# Patient Record
Sex: Male | Born: 1975 | Race: White | Hispanic: No | Smoking: Current every day smoker
Health system: Southern US, Community
[De-identification: ages and names within clinical notes are randomized; demographics above are authoritative.]

## PROBLEM LIST (undated history)

## (undated) ENCOUNTER — Emergency Department (HOSPITAL_COMMUNITY): Admission: EM

## (undated) DIAGNOSIS — F909 Attention-deficit hyperactivity disorder, unspecified type: Secondary | ICD-10-CM

## (undated) DIAGNOSIS — K219 Gastro-esophageal reflux disease without esophagitis: Secondary | ICD-10-CM

## (undated) DIAGNOSIS — L0291 Cutaneous abscess, unspecified: Secondary | ICD-10-CM

## (undated) HISTORY — PX: INNER EAR SURGERY: SHX679

---

## 1997-10-24 ENCOUNTER — Emergency Department (HOSPITAL_COMMUNITY): Admission: EM | Admit: 1997-10-24 | Discharge: 1997-10-24 | Payer: Self-pay | Admitting: Emergency Medicine

## 2003-03-09 ENCOUNTER — Other Ambulatory Visit (HOSPITAL_COMMUNITY): Admission: RE | Admit: 2003-03-09 | Discharge: 2003-03-13 | Payer: Self-pay | Admitting: Psychiatry

## 2005-04-03 ENCOUNTER — Emergency Department (HOSPITAL_COMMUNITY): Admission: EM | Admit: 2005-04-03 | Discharge: 2005-04-03 | Payer: Self-pay | Admitting: Emergency Medicine

## 2005-10-29 ENCOUNTER — Emergency Department (HOSPITAL_COMMUNITY): Admission: EM | Admit: 2005-10-29 | Discharge: 2005-10-29 | Payer: Self-pay | Admitting: Emergency Medicine

## 2005-11-07 ENCOUNTER — Ambulatory Visit: Payer: Self-pay | Admitting: Internal Medicine

## 2006-05-28 ENCOUNTER — Ambulatory Visit: Payer: Self-pay | Admitting: Internal Medicine

## 2006-05-28 LAB — CONVERTED CEMR LAB
Basophils Relative: 1.9 % — ABNORMAL HIGH (ref 0.0–1.0)
Eosinophils Relative: 5.7 % — ABNORMAL HIGH (ref 0.0–5.0)
HCT: 44.8 % (ref 39.0–52.0)
Hemoglobin: 15.8 g/dL (ref 13.0–17.0)
Lymphocytes Relative: 23 % (ref 12.0–46.0)
Neutrophils Relative %: 63.8 % (ref 43.0–77.0)
WBC: 7.5 10*3/uL (ref 4.5–10.5)

## 2006-06-04 ENCOUNTER — Encounter (INDEPENDENT_AMBULATORY_CARE_PROVIDER_SITE_OTHER): Payer: Self-pay | Admitting: *Deleted

## 2006-06-04 ENCOUNTER — Ambulatory Visit: Payer: Self-pay | Admitting: Internal Medicine

## 2007-07-28 ENCOUNTER — Emergency Department (HOSPITAL_COMMUNITY): Admission: EM | Admit: 2007-07-28 | Discharge: 2007-07-29 | Payer: Self-pay | Admitting: Emergency Medicine

## 2010-09-08 NOTE — Assessment & Plan Note (Signed)
[facility]                         GASTROENTEROLOGY OFFICE NOTE   NAME:COLEMANNarada, Uzzle                     MRN:          [id number]  DATE:05/28/2006                            DOB:          [birth year]    CHIEF COMPLAINT:  Indigestion.   HISTORY:  A 35 year old white man that has had problems with a lot of  indigestion.  It has gotten quite worse in the last month.  He describes  heartburn sensations for some time, usually controlled with ranitidine  or antacids.  However, lately he eats and he feels full in the  subxyphoid area, throat feels irritated.  He has not had any nausea, but  he will have post prandial vomiting, particularly if he eats a large  meal.  This occurs within 30 minutes to 60 minutes after eating.  He may  have lost a little bit of weight.  In the past week or so, things are  much worse, he says.  He says alcohol will set him on fire.  He drinks  EchoStar about every 3rd day.  He did not really quantify the  amounts, but it sounds like several drinks.  He has some gas and  bloating, but no bowel movement changes, and no melena or signs of  bleeding.   MEDICATIONS:  1. Zantac.  2. Mylanta.   ALLERGIES:  NO KNOWN DRUG ALLERGIES.   PAST MEDICAL HISTORY:  Otherwise unremarkable.  No surgeries, no chronic  medical conditions.   FAMILY HISTORY:  Noncontributory.   Our review is negative.   SOCIAL HISTORY:  He is married and he works in Therapist, music care.  He has 1  daughter.  He smokes a half pack per day for 10 years, and I have  counseled him as to quitting today.  Alcohol as above.  He has never had  any trouble with alcohol with DUI or work-related issues, etc.   REVIEW OF SYSTEMS:  Otherwise negative.   PHYSICAL EXAMINATION:  Reveals a well-developed, well-nourished, young  white man in no acute distress.  Height 5 feet 10 inches, weight 161 pounds.  Blood pressure 110/70,  pulse 60.  EYES:  Anicteric.  ENT:  Normal  mouth and posterior oropharynx.  NECK:  Supple, no thyroid mass.  CHEST:  Clear.  HEART:  S1, S2, no murmurs, gallops, or rubs.  ABDOMEN:  Soft, nontender without organomegaly or mass.  EXTREMITIES:  No peripheral edema.  SKIN:  He has some tattoos.  He has some erythema on the upper trunk,  anteriorly and posteriorly (he was at a tanning bed yesterday).  PSYCH:  He is alert and oriented x3.   ASSESSMENT AND PLAN:  Epigastric pain, vomiting, ?dysphagia, heartburn  and indigestion.  He has postprandial vomiting.  I am concerned about  gastric outlet obstruction developing.  I did not get a history of  NSAIDS or anything.  He could have an h. pylori-related ulcer.  Malignancy is in the differential as well.   PLAN:  Schedule upper GI endoscopy.  Risks, benefits and indications are  explained.  CBC will be checked today as well.  Further plans pending  the above.   I appreciate the opportunity to care for this patient.     Iva Boop, MD,FACG  Electronically Signed    CEG/MedQ  DD: 05/28/2006  DT: 05/28/2006  Job #: [id number]   cc:   Titus Dubin. Alwyn Ren, MD,FACP,FCCP

## 2010-09-08 NOTE — Assessment & Plan Note (Signed)
[facility]                         GASTROENTEROLOGY OFFICE NOTE   NAME:COLEMANTylon, Kemmerling                     MRN:          [id number]  DATE:05/28/2006                            DOB:          November 19, 1975    ASSESSMENT AND PLAN:  Epigastric pain, vomiting, ?dysphagia, heartburn  and indigestion.  He has postprandial vomiting.  I am concerned about  gastric outlet obstruction developing.  I did not get a history of  NSAIDS or anything.  He could have an h. pylori-related ulcer.  Malignancy is in the differential as well.   PLAN:  Schedule upper GI endoscopy.  Risks, benefits and indications are  explained.  CBC will be checked today as well.  Further plans pending  the above.     Iva Boop, MD,FACG     CEG/MedQ  DD: 05/28/2006  DT: 05/28/2006  Job #: [id number]   cc:   Titus Dubin. Alwyn Ren, MD,FACP,FCCP

## 2010-09-08 NOTE — Assessment & Plan Note (Signed)
[facility]                          [place] OFFICE NOTE   NAME:Johnathan Brown, Johnathan Brown                     MRN:          [id number]  DATE:11/07/2005                            DOB:          03-24-1976    Johnathan Brown was seen November 07, 2005, brought in by his mother, Johnathan Brown, with multiple complaints.   The chief concern was that he was under an increased amount of stress, with  stomach pain present for 1 year.  He was taking over-the-counter Zantac 6 to  7 of the 150 mg pills per day.  He was having difficulty with insomnia  related to his separation from his wife.  They have a small child, age 30.   Since the separation he has had encounters with other women, and wishes  tests for bacterial and yeast diseases.  Subsequently I learned that he  already had [facility] and other [facility]   He denies dysphagia, but questions black stool.  He has lost 20 pounds in 2  months.  Although our record demonstrates a weight of 142 in July 2004, he  stated that he weighed 180 pounds 2 months prior to the November 07, 2005 visit.  At this time weight was 159.2.   He has never been hospitalized.   His mother has Barrett's esophagus, and father has Crohn's disease.   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  His wife refuses counseling.   He has been drinking 5 to 6 alcoholic beverages a day, 4 to 5 glasses of tea  or cola per day.   He does admit to suicidal ideation.  He has seen a psychiatrist in the past,  but stated that his past drug abuse history made such care of no benefit.  He also made the comment, She couldn't speak [place].   PHYSICAL EXAMINATION:  HEENT:  Oropharynx revealed gelatinous change to his  uvula. There was no significant erythema.  LUNGS:  He has minimal rales.  HEART:  An S4 with flow murmur was noted.  ABDOMEN:  Nontender.  He had no organomegaly.  SKIN:  Warm and dry.  NEUROLOGIC:  He was oriented to time, place and person, but had no interest  in addressing preventive measures.   Specifically, after I listed the triggers for hyperacidity and reflux, he  stated that he could not make changes in any of these.  I did prescribe  Nexium twice a day, and stated that he would need a gastroenterologic  consultation if symptoms persisted for possible endoscopy.   I stated that psychiatric evaluation because of suicidal ideation was  mandatory, but he had no interest in pursuing this.   I described the risks of unprotected sex as a preventive measure.   His hemoglobin was 15.9.  Stool cards are pending.  Urinalysis revealed no  growth.   Unless he is willing to seek psychiatric help, I feel that his prognosis is  poor.  I have discussed commitment with his mother after he had left the  exam room, but she did not  believe she could pursue this.  Without such care  and improvement in his insight, I am fearful of a possible catastrophic  outcome.  This was expressed to his mother.                                   Titus Dubin. Alwyn Ren, MD, [facility]   [facility]/MedQ  DD:  11/13/2005  DT:  11/14/2005  Job #:  [id number]

## 2015-01-22 DIAGNOSIS — L0291 Cutaneous abscess, unspecified: Secondary | ICD-10-CM

## 2015-01-22 HISTORY — DX: Cutaneous abscess, unspecified: L02.91

## 2015-01-24 ENCOUNTER — Encounter (HOSPITAL_COMMUNITY): Payer: Self-pay | Admitting: Emergency Medicine

## 2015-01-24 DIAGNOSIS — L02413 Cutaneous abscess of right upper limb: Principal | ICD-10-CM | POA: Diagnosis present

## 2015-01-24 DIAGNOSIS — L03113 Cellulitis of right upper limb: Secondary | ICD-10-CM | POA: Diagnosis present

## 2015-01-24 DIAGNOSIS — K219 Gastro-esophageal reflux disease without esophagitis: Secondary | ICD-10-CM | POA: Diagnosis present

## 2015-01-24 DIAGNOSIS — W460XXA Contact with hypodermic needle, initial encounter: Secondary | ICD-10-CM | POA: Diagnosis present

## 2015-01-24 DIAGNOSIS — F1910 Other psychoactive substance abuse, uncomplicated: Secondary | ICD-10-CM | POA: Diagnosis present

## 2015-01-24 DIAGNOSIS — Z23 Encounter for immunization: Secondary | ICD-10-CM

## 2015-01-24 DIAGNOSIS — F17200 Nicotine dependence, unspecified, uncomplicated: Secondary | ICD-10-CM | POA: Diagnosis present

## 2015-01-24 DIAGNOSIS — F553 Abuse of steroids or hormones: Secondary | ICD-10-CM | POA: Diagnosis present

## 2015-01-24 DIAGNOSIS — M545 Low back pain: Secondary | ICD-10-CM | POA: Diagnosis present

## 2015-01-24 LAB — CBC
HCT: 43.2 % (ref 39.0–52.0)
Hemoglobin: 14.8 g/dL (ref 13.0–17.0)
MCH: 29 pg (ref 26.0–34.0)
MCHC: 34.3 g/dL (ref 30.0–36.0)
MCV: 84.5 fL (ref 78.0–100.0)
Platelets: 232 K/uL (ref 150–400)
RBC: 5.11 MIL/uL (ref 4.22–5.81)
RDW: 13.3 % (ref 11.5–15.5)
WBC: 10.4 K/uL (ref 4.0–10.5)

## 2015-01-24 LAB — BASIC METABOLIC PANEL WITH GFR
Anion gap: 9 (ref 5–15)
BUN: 13 mg/dL (ref 6–20)
CO2: 27 mmol/L (ref 22–32)
Calcium: 8.5 mg/dL — ABNORMAL LOW (ref 8.9–10.3)
Chloride: 99 mmol/L — ABNORMAL LOW (ref 101–111)
Creatinine, Ser: 0.98 mg/dL (ref 0.61–1.24)
GFR calc Af Amer: >60 mL/min
GFR calc non Af Amer: >60 mL/min
Glucose, Bld: 108 mg/dL — ABNORMAL HIGH (ref 65–99)
Potassium: 4 mmol/L (ref 3.5–5.1)
Sodium: 135 mmol/L (ref 135–145)

## 2015-01-24 NOTE — ED Notes (Signed)
Pt reports that he injected steroids into R upper arm 3 days ago and now arm is swollen red and painful. Unsure of fevers- sts that he hasn't been feeling good tonight.

## 2015-01-25 ENCOUNTER — Encounter (HOSPITAL_COMMUNITY): Payer: Self-pay | Admitting: General Practice

## 2015-01-25 ENCOUNTER — Inpatient Hospital Stay (HOSPITAL_COMMUNITY)
Admission: EM | Admit: 2015-01-25 | Discharge: 2015-01-29 | DRG: 581 | Discharge status: Against Medical Advice | Payer: Self-pay | Attending: Internal Medicine | Admitting: Internal Medicine

## 2015-01-25 ENCOUNTER — Emergency Department (HOSPITAL_COMMUNITY): Payer: Self-pay

## 2015-01-25 ENCOUNTER — Observation Stay (HOSPITAL_COMMUNITY): Payer: MEDICAID | Admitting: Anesthesiology

## 2015-01-25 ENCOUNTER — Observation Stay (HOSPITAL_COMMUNITY): Payer: Self-pay | Admitting: Anesthesiology

## 2015-01-25 ENCOUNTER — Encounter (HOSPITAL_COMMUNITY)
Admission: EM | Discharge status: Against Medical Advice | Payer: Self-pay | Source: Home / Self Care | Attending: Internal Medicine

## 2015-01-25 DIAGNOSIS — G8929 Other chronic pain: Secondary | ICD-10-CM | POA: Diagnosis present

## 2015-01-25 DIAGNOSIS — M25521 Pain in right elbow: Secondary | ICD-10-CM

## 2015-01-25 DIAGNOSIS — L02413 Cutaneous abscess of right upper limb: Secondary | ICD-10-CM | POA: Diagnosis present

## 2015-01-25 DIAGNOSIS — Z720 Tobacco use: Secondary | ICD-10-CM | POA: Diagnosis present

## 2015-01-25 DIAGNOSIS — M545 Other chronic pain: Secondary | ICD-10-CM | POA: Diagnosis present

## 2015-01-25 DIAGNOSIS — L03113 Cellulitis of right upper limb: Secondary | ICD-10-CM | POA: Insufficient documentation

## 2015-01-25 DIAGNOSIS — L0291 Cutaneous abscess, unspecified: Secondary | ICD-10-CM | POA: Diagnosis present

## 2015-01-25 DIAGNOSIS — L02419 Cutaneous abscess of limb, unspecified: Secondary | ICD-10-CM | POA: Diagnosis present

## 2015-01-25 DIAGNOSIS — F1910 Other psychoactive substance abuse, uncomplicated: Secondary | ICD-10-CM | POA: Diagnosis present

## 2015-01-25 DIAGNOSIS — L0390 Cellulitis, unspecified: Secondary | ICD-10-CM

## 2015-01-25 HISTORY — DX: Cutaneous abscess, unspecified: L02.91

## 2015-01-25 HISTORY — DX: Gastro-esophageal reflux disease without esophagitis: K21.9

## 2015-01-25 HISTORY — PX: I&D EXTREMITY: SHX5045

## 2015-01-25 LAB — RAPID URINE DRUG SCREEN, HOSP PERFORMED
AMPHETAMINES: POSITIVE — AB
BENZODIAZEPINES: POSITIVE — AB
Barbiturates: NOT DETECTED
Cocaine: NOT DETECTED
OPIATES: POSITIVE — AB
Tetrahydrocannabinol: POSITIVE — AB

## 2015-01-25 LAB — PROTIME-INR
INR: 1.07 (ref 0.00–1.49)
PROTHROMBIN TIME: 14.1 s (ref 11.6–15.2)

## 2015-01-25 LAB — CBC WITH DIFFERENTIAL/PLATELET
Basophils Absolute: 0 10*3/uL (ref 0.0–0.1)
Basophils Relative: 0 %
EOS PCT: 5 %
Eosinophils Absolute: 0.5 10*3/uL (ref 0.0–0.7)
HCT: 48.4 % (ref 39.0–52.0)
Hemoglobin: 16.3 g/dL (ref 13.0–17.0)
LYMPHS ABS: 1.9 10*3/uL (ref 0.7–4.0)
LYMPHS PCT: 21 %
MCH: 28.5 pg (ref 26.0–34.0)
MCHC: 33.7 g/dL (ref 30.0–36.0)
MCV: 84.8 fL (ref 78.0–100.0)
MONO ABS: 0.7 10*3/uL (ref 0.1–1.0)
Monocytes Relative: 7 %
Neutro Abs: 6.1 10*3/uL (ref 1.7–7.7)
Neutrophils Relative %: 67 %
PLATELETS: 264 10*3/uL (ref 150–400)
RBC: 5.71 MIL/uL (ref 4.22–5.81)
RDW: 13.5 % (ref 11.5–15.5)
WBC: 9.1 10*3/uL (ref 4.0–10.5)

## 2015-01-25 LAB — COMPREHENSIVE METABOLIC PANEL
ALT: 13 U/L — AB (ref 17–63)
AST: 22 U/L (ref 15–41)
Albumin: 3.7 g/dL (ref 3.5–5.0)
Alkaline Phosphatase: 77 U/L (ref 38–126)
Anion gap: 11 (ref 5–15)
BUN: 11 mg/dL (ref 6–20)
CHLORIDE: 101 mmol/L (ref 101–111)
CO2: 27 mmol/L (ref 22–32)
CREATININE: 0.82 mg/dL (ref 0.61–1.24)
Calcium: 9.2 mg/dL (ref 8.9–10.3)
Glucose, Bld: 90 mg/dL (ref 65–99)
Potassium: 4.2 mmol/L (ref 3.5–5.1)
Sodium: 139 mmol/L (ref 135–145)
TOTAL PROTEIN: 6.7 g/dL (ref 6.5–8.1)
Total Bilirubin: 0.9 mg/dL (ref 0.3–1.2)

## 2015-01-25 LAB — URINALYSIS, ROUTINE W REFLEX MICROSCOPIC
Bilirubin Urine: NEGATIVE
GLUCOSE, UA: NEGATIVE mg/dL
HGB URINE DIPSTICK: NEGATIVE
Ketones, ur: NEGATIVE mg/dL
Leukocytes, UA: NEGATIVE
Nitrite: NEGATIVE
Protein, ur: NEGATIVE mg/dL
SPECIFIC GRAVITY, URINE: 1.019 (ref 1.005–1.030)
Urobilinogen, UA: 1 mg/dL (ref 0.0–1.0)
pH: 6 (ref 5.0–8.0)

## 2015-01-25 LAB — TYPE AND SCREEN
ABO/RH(D): O POS
Antibody Screen: NEGATIVE

## 2015-01-25 LAB — SURGICAL PCR SCREEN
MRSA, PCR: NEGATIVE
Staphylococcus aureus: POSITIVE — AB

## 2015-01-25 SURGERY — MINOR IRRIGATION AND DEBRIDEMENT EXTREMITY
Anesthesia: General | Site: Arm Upper | Laterality: Right

## 2015-01-25 MED ORDER — METHOCARBAMOL 1000 MG/10ML IJ SOLN
500.0000 mg | Freq: Four times a day (QID) | INTRAVENOUS | Status: DC | PRN
  Filled 2015-01-25: qty 5

## 2015-01-25 MED ORDER — FENTANYL CITRATE (PF) 250 MCG/5ML IJ SOLN
INTRAMUSCULAR | Status: AC
  Filled 2015-01-25: qty 5

## 2015-01-25 MED ORDER — 0.9 % SODIUM CHLORIDE (POUR BTL) OPTIME
TOPICAL | Status: DC | PRN
  Administered 2015-01-25: 1000 mL

## 2015-01-25 MED ORDER — LACTATED RINGERS IV SOLN
INTRAVENOUS | Status: DC | PRN
  Administered 2015-01-25: 21:00:00 via INTRAVENOUS

## 2015-01-25 MED ORDER — ONDANSETRON HCL 4 MG PO TABS: 4.0000 mg | ORAL_TABLET | Freq: Four times a day (QID) | ORAL | Status: DC | PRN

## 2015-01-25 MED ORDER — PNEUMOCOCCAL VAC POLYVALENT 25 MCG/0.5ML IJ INJ
0.5000 mL | INJECTION | INTRAMUSCULAR | Status: AC
  Administered 2015-01-26: 0.5 mL via INTRAMUSCULAR
  Filled 2015-01-25 (×2): qty 0.5

## 2015-01-25 MED ORDER — LIDOCAINE HCL (CARDIAC) 20 MG/ML IV SOLN
INTRAVENOUS | Status: AC
  Filled 2015-01-25: qty 20

## 2015-01-25 MED ORDER — SUCCINYLCHOLINE CHLORIDE 20 MG/ML IJ SOLN
INTRAMUSCULAR | Status: AC
  Filled 2015-01-25: qty 1

## 2015-01-25 MED ORDER — ACETAMINOPHEN 325 MG PO TABS: 650.0000 mg | ORAL_TABLET | Freq: Four times a day (QID) | ORAL | Status: DC | PRN

## 2015-01-25 MED ORDER — ALUM & MAG HYDROXIDE-SIMETH 200-200-20 MG/5ML PO SUSP: 30.0000 mL | Freq: Four times a day (QID) | ORAL | Status: DC | PRN

## 2015-01-25 MED ORDER — HYDROMORPHONE HCL 1 MG/ML IJ SOLN
0.2500 mg | INTRAMUSCULAR | Status: DC | PRN
  Administered 2015-01-25 (×3): 0.5 mg via INTRAVENOUS

## 2015-01-25 MED ORDER — SODIUM CHLORIDE 0.9 % IV SOLN
INTRAVENOUS | Status: DC
  Administered 2015-01-25: 23:00:00 via INTRAVENOUS

## 2015-01-25 MED ORDER — FENTANYL CITRATE (PF) 100 MCG/2ML IJ SOLN
INTRAMUSCULAR | Status: DC | PRN
  Administered 2015-01-25 (×2): 50 ug via INTRAVENOUS
  Administered 2015-01-25: 200 ug via INTRAVENOUS

## 2015-01-25 MED ORDER — ONDANSETRON HCL 4 MG/2ML IJ SOLN
INTRAMUSCULAR | Status: AC
  Filled 2015-01-25: qty 8

## 2015-01-25 MED ORDER — ONDANSETRON HCL 4 MG/2ML IJ SOLN
INTRAMUSCULAR | Status: DC | PRN
  Administered 2015-01-25: 4 mg via INTRAVENOUS

## 2015-01-25 MED ORDER — ENOXAPARIN SODIUM 40 MG/0.4ML ~~LOC~~ SOLN
40.0000 mg | SUBCUTANEOUS | Status: DC
  Administered 2015-01-25 – 2015-01-26 (×2): 40 mg via SUBCUTANEOUS
  Filled 2015-01-25 (×5): qty 0.4

## 2015-01-25 MED ORDER — ONDANSETRON HCL 4 MG/2ML IJ SOLN: 4.0000 mg | Freq: Four times a day (QID) | INTRAMUSCULAR | Status: DC | PRN

## 2015-01-25 MED ORDER — VANCOMYCIN HCL IN DEXTROSE 1-5 GM/200ML-% IV SOLN
1000.0000 mg | Freq: Three times a day (TID) | INTRAVENOUS | Status: DC
  Administered 2015-01-25 – 2015-01-29 (×12): 1000 mg via INTRAVENOUS
  Filled 2015-01-25 (×14): qty 200

## 2015-01-25 MED ORDER — MIDAZOLAM HCL 2 MG/2ML IJ SOLN
INTRAMUSCULAR | Status: AC
  Filled 2015-01-25: qty 4

## 2015-01-25 MED ORDER — MIDAZOLAM HCL 5 MG/5ML IJ SOLN
INTRAMUSCULAR | Status: DC | PRN
  Administered 2015-01-25: 2 mg via INTRAVENOUS

## 2015-01-25 MED ORDER — ROCURONIUM BROMIDE 50 MG/5ML IV SOLN
INTRAVENOUS | Status: AC
  Filled 2015-01-25: qty 1

## 2015-01-25 MED ORDER — ACETAMINOPHEN 650 MG RE SUPP: 650.0000 mg | Freq: Four times a day (QID) | RECTAL | Status: DC | PRN

## 2015-01-25 MED ORDER — MIDAZOLAM HCL 2 MG/2ML IJ SOLN
INTRAMUSCULAR | Status: AC
Start: 2015-01-25 — End: 2015-01-25
  Filled 2015-01-25: qty 4

## 2015-01-25 MED ORDER — MORPHINE SULFATE (PF) 2 MG/ML IV SOLN
1.0000 mg | INTRAVENOUS | Status: DC | PRN
  Administered 2015-01-25 – 2015-01-26 (×7): 1 mg via INTRAVENOUS
  Filled 2015-01-25 (×9): qty 1

## 2015-01-25 MED ORDER — METHOCARBAMOL 500 MG PO TABS
500.0000 mg | ORAL_TABLET | Freq: Four times a day (QID) | ORAL | Status: DC | PRN
  Administered 2015-01-26 – 2015-01-28 (×3): 500 mg via ORAL
  Filled 2015-01-25 (×3): qty 1

## 2015-01-25 MED ORDER — CHLORHEXIDINE GLUCONATE CLOTH 2 % EX PADS
6.0000 | MEDICATED_PAD | Freq: Every day | CUTANEOUS | Status: DC
Start: 2015-01-25 — End: 2015-01-29
  Administered 2015-01-25 – 2015-01-28 (×4): 6 via TOPICAL

## 2015-01-25 MED ORDER — PHENYLEPHRINE 40 MCG/ML (10ML) SYRINGE FOR IV PUSH (FOR BLOOD PRESSURE SUPPORT)
PREFILLED_SYRINGE | INTRAVENOUS | Status: AC
  Filled 2015-01-25: qty 20

## 2015-01-25 MED ORDER — LIDOCAINE HCL (CARDIAC) 20 MG/ML IV SOLN
INTRAVENOUS | Status: DC | PRN
  Administered 2015-01-25: 50 mg via INTRAVENOUS

## 2015-01-25 MED ORDER — PROMETHAZINE HCL 25 MG/ML IJ SOLN: 6.2500 mg | INTRAMUSCULAR | Status: DC | PRN

## 2015-01-25 MED ORDER — HYDROMORPHONE HCL 1 MG/ML IJ SOLN
INTRAMUSCULAR | Status: AC
  Filled 2015-01-25: qty 1

## 2015-01-25 MED ORDER — MEPERIDINE HCL 25 MG/ML IJ SOLN: 6.2500 mg | INTRAMUSCULAR | Status: DC | PRN

## 2015-01-25 MED ORDER — NICOTINE 21 MG/24HR TD PT24
21.0000 mg | MEDICATED_PATCH | Freq: Every day | TRANSDERMAL | Status: DC
  Administered 2015-01-26 – 2015-01-28 (×3): 21 mg via TRANSDERMAL
  Filled 2015-01-25 (×4): qty 1

## 2015-01-25 MED ORDER — PROPOFOL 10 MG/ML IV BOLUS
INTRAVENOUS | Status: DC | PRN
  Administered 2015-01-25: 150 mg via INTRAVENOUS
  Administered 2015-01-25: 50 mg via INTRAVENOUS

## 2015-01-25 MED ORDER — METOCLOPRAMIDE HCL 10 MG PO TABS: 5.0000 mg | ORAL_TABLET | Freq: Three times a day (TID) | ORAL | Status: DC | PRN

## 2015-01-25 MED ORDER — OXYCODONE HCL 5 MG PO TABS
5.0000 mg | ORAL_TABLET | ORAL | Status: DC | PRN
  Administered 2015-01-25 – 2015-01-27 (×2): 5 mg via ORAL
  Filled 2015-01-25 (×2): qty 1

## 2015-01-25 MED ORDER — KCL IN DEXTROSE-NACL 20-5-0.9 MEQ/L-%-% IV SOLN
INTRAVENOUS | Status: DC
  Administered 2015-01-25: 11:00:00 via INTRAVENOUS
  Filled 2015-01-25 (×9): qty 1000

## 2015-01-25 MED ORDER — CETYLPYRIDINIUM CHLORIDE 0.05 % MT LIQD
7.0000 mL | Freq: Two times a day (BID) | OROMUCOSAL | Status: DC
  Administered 2015-01-25 – 2015-01-26 (×3): 7 mL via OROMUCOSAL

## 2015-01-25 MED ORDER — PIPERACILLIN-TAZOBACTAM 3.375 G IVPB
3.3750 g | Freq: Three times a day (TID) | INTRAVENOUS | Status: DC
Start: 2015-01-25 — End: 2015-01-29
  Administered 2015-01-25 – 2015-01-29 (×12): 3.375 g via INTRAVENOUS
  Filled 2015-01-25 (×14): qty 50

## 2015-01-25 MED ORDER — VANCOMYCIN HCL IN DEXTROSE 1-5 GM/200ML-% IV SOLN
1000.0000 mg | Freq: Once | INTRAVENOUS | Status: AC
  Administered 2015-01-25: 1000 mg via INTRAVENOUS
  Filled 2015-01-25: qty 200

## 2015-01-25 MED ORDER — INFLUENZA VAC SPLIT QUAD 0.5 ML IM SUSY
0.5000 mL | PREFILLED_SYRINGE | INTRAMUSCULAR | Status: AC
  Administered 2015-01-26: 0.5 mL via INTRAMUSCULAR
  Filled 2015-01-25 (×2): qty 0.5

## 2015-01-25 MED ORDER — MUPIROCIN 2 % EX OINT
1.0000 "application " | TOPICAL_OINTMENT | Freq: Two times a day (BID) | CUTANEOUS | Status: DC
  Administered 2015-01-25 – 2015-01-28 (×7): 1 via NASAL
  Filled 2015-01-25 (×2): qty 22

## 2015-01-25 MED ORDER — DIAZEPAM 5 MG PO TABS
5.0000 mg | ORAL_TABLET | Freq: Two times a day (BID) | ORAL | Status: DC | PRN
  Administered 2015-01-25 – 2015-01-28 (×6): 5 mg via ORAL
  Filled 2015-01-25 (×7): qty 1

## 2015-01-25 MED ORDER — METOCLOPRAMIDE HCL 5 MG/ML IJ SOLN: 5.0000 mg | Freq: Three times a day (TID) | INTRAMUSCULAR | Status: DC | PRN

## 2015-01-25 SURGICAL SUPPLY — 41 items
BLADE SURG 10 STRL SS (BLADE) IMPLANT
BLADE SURG 21 STRL SS (BLADE) ×3 IMPLANT
BNDG COHESIVE 4X5 TAN STRL (GAUZE/BANDAGES/DRESSINGS) ×9 IMPLANT
BNDG COHESIVE 6X5 TAN STRL LF (GAUZE/BANDAGES/DRESSINGS) IMPLANT
BNDG GAUZE ELAST 4 BULKY (GAUZE/BANDAGES/DRESSINGS) ×3 IMPLANT
COVER SURGICAL LIGHT HANDLE (MISCELLANEOUS) ×9 IMPLANT
DRAIN PENROSE 1/4X12 LTX STRL (WOUND CARE) ×3 IMPLANT
DRAPE U-SHAPE 47X51 STRL (DRAPES) ×3 IMPLANT
DRSG ADAPTIC 3X8 NADH LF (GAUZE/BANDAGES/DRESSINGS) ×3 IMPLANT
DURAPREP 26ML APPLICATOR (WOUND CARE) ×3 IMPLANT
ELECT CAUTERY BLADE 6.4 (BLADE) IMPLANT
ELECT REM PT RETURN 9FT ADLT (ELECTROSURGICAL)
ELECTRODE REM PT RTRN 9FT ADLT (ELECTROSURGICAL) IMPLANT
GAUZE SPONGE 4X4 12PLY STRL (GAUZE/BANDAGES/DRESSINGS) ×3 IMPLANT
GLOVE BIOGEL PI IND STRL 9 (GLOVE) ×1 IMPLANT
GLOVE BIOGEL PI INDICATOR 9 (GLOVE) ×2
GLOVE SURG ORTHO 9.0 STRL STRW (GLOVE) ×3 IMPLANT
GOWN STRL REUS W/ TWL LRG LVL3 (GOWN DISPOSABLE) ×1 IMPLANT
GOWN STRL REUS W/ TWL XL LVL3 (GOWN DISPOSABLE) ×1 IMPLANT
GOWN STRL REUS W/TWL LRG LVL3 (GOWN DISPOSABLE) ×2
GOWN STRL REUS W/TWL XL LVL3 (GOWN DISPOSABLE) ×2
HANDPIECE INTERPULSE COAX TIP (DISPOSABLE)
KIT BASIN OR (CUSTOM PROCEDURE TRAY) ×3 IMPLANT
KIT ROOM TURNOVER OR (KITS) ×3 IMPLANT
MANIFOLD NEPTUNE II (INSTRUMENTS) IMPLANT
NS IRRIG 1000ML POUR BTL (IV SOLUTION) ×3 IMPLANT
PACK ORTHO EXTREMITY (CUSTOM PROCEDURE TRAY) ×3 IMPLANT
PAD ARMBOARD 7.5X6 YLW CONV (MISCELLANEOUS) ×6 IMPLANT
SET HNDPC FAN SPRY TIP SCT (DISPOSABLE) IMPLANT
STOCKINETTE IMPERVIOUS 9X36 MD (GAUZE/BANDAGES/DRESSINGS) ×3 IMPLANT
SUT ETHILON 2 0 PSLX (SUTURE) ×6 IMPLANT
SWAB COLLECTION DEVICE MRSA (MISCELLANEOUS) ×3 IMPLANT
SWAB CULTURE LIQUID MINI MALE (MISCELLANEOUS) ×3 IMPLANT
TOWEL OR 17X24 6PK STRL BLUE (TOWEL DISPOSABLE) ×3 IMPLANT
TOWEL OR 17X26 10 PK STRL BLUE (TOWEL DISPOSABLE) ×3 IMPLANT
TUBE ANAEROBIC SPECIMEN COL (MISCELLANEOUS) ×3 IMPLANT
TUBE CONNECTING 12'X1/4 (SUCTIONS) ×1
TUBE CONNECTING 12X1/4 (SUCTIONS) ×2 IMPLANT
UNDERPAD 30X30 INCONTINENT (UNDERPADS AND DIAPERS) ×3 IMPLANT
WATER STERILE IRR 1000ML POUR (IV SOLUTION) ×3 IMPLANT
YANKAUER SUCT BULB TIP NO VENT (SUCTIONS) ×3 IMPLANT

## 2015-01-25 NOTE — Progress Notes (Addendum)
UDS positive for amphetamines, benzodiazepine's and THC. Also positive for opiates but patient had been taking Suboxone prior to admission.  Per CM note from 302 pm today pt admitted to IVDA w/ Heroin and last use was 7pm last night- pt reported to CM felt like was detoxing- HIV ordered  Junious Silk, ANP

## 2015-01-25 NOTE — Progress Notes (Signed)
Father exited room looking for RN.  Informed that "I've convinced him to stay for now, but he wants to eat.  If they can't do the surgery tonight, then he wants to eat."  Called OR.  Pt will take another hour before procedure.  Father to inform pt.  Will continue to monitor.

## 2015-01-25 NOTE — Anesthesia Postprocedure Evaluation (Signed)
  Anesthesia Post-op Note  Patient: Johnathan Brown  Procedure(s) Performed: Procedure(s): MINOR IRRIGATION AND DEBRIDEMENT RIGHT ARM (Right)  Patient Location: PACU  Anesthesia Type:General  Level of Consciousness: awake and alert   Airway and Oxygen Therapy: Patient Spontanous Breathing  Post-op Pain: Controlled  Post-op Assessment: Post-op Vital signs reviewed, Patient's Cardiovascular Status Stable and Respiratory Function Stable  Post-op Vital Signs: Reviewed  Filed Vitals:   01/25/15 2215  BP:   Pulse: 74  Temp: 36.9 C  Resp: 11    Complications: No apparent anesthesia complications

## 2015-01-25 NOTE — Transfer of Care (Signed)
Immediate Anesthesia Transfer of Care Note  Patient: Johnathan Brown  Procedure(s) Performed: Procedure(s): MINOR IRRIGATION AND DEBRIDEMENT RIGHT ARM (Right)  Patient Location: PACU  Anesthesia Type:General  Level of Consciousness: pt very drowsy   Airway & Oxygen Therapy: Patient Spontanous Breathing and Patient connected to face mask oxygen  Post-op Assessment: Report given to RN and Post -op Vital signs reviewed and stable  Post vital signs: Reviewed and stable  Last Vitals:  Filed Vitals:   01/25/15 2145  BP: 116/68  Pulse: 70  Temp: 36.9 C  Resp: 11    Complications: No apparent anesthesia complications

## 2015-01-25 NOTE — Progress Notes (Signed)
ANTIBIOTIC CONSULT NOTE - INITIAL  Pharmacy Consult for Vancomycin  Indication: Upper extremity cellulitis  No Known Allergies  Patient Measurements: Height:  (177.8 cm) Weight: 162 lb 6.4 oz (73.664 kg) IBW/kg (Calculated) : 73  Vital Signs: Temp: 98.5 F (36.9 C) (10/04 0703) Temp Source: Oral (10/03 2242) BP: 116/77 mmHg (10/04 0703) Pulse Rate: 72 (10/04 0703)  Labs:  Recent Labs  01/24/15 2255  WBC 10.4  HGB 14.8  PLT 232  CREATININE 0.98   Estimated Creatinine Clearance: 106.6 mL/min (by C-G formula based on Cr of 0.98).   Assessment: Upper extremity cellulitis s/p testosterone injection, WBC WNL, renal function good, pt is afebrile, other labs as above.   Goal of Therapy:   Vancomycin trough level 15-20 mcg/ml  Plan:  -Vancomycin 1000 mg IV q8h -Drug levels at steady state   Abran Duke 01/25/2015,7:15 AM

## 2015-01-25 NOTE — Consult Note (Signed)
Reason for Consult: Abscess right arm Referring Physician: ER physician  Johnathan Brown is an 39 y.o. male.  HPI: Patient is a 39 year old gentleman who states he's had a 3 day history of an abscess in his right arm. Patient states that he injected his triceps with a steroid. He is unsure of what type a steroid it was.  History reviewed. No pertinent past medical history.  History reviewed. No pertinent past surgical history.  No family history on file.  Social History:  reports that he has been smoking.  He does not have any smokeless tobacco history on file. He reports that he does not drink alcohol or use illicit drugs.  Allergies: No Known Allergies  Medications: I have reviewed the patient's current medications.  Results for orders placed or performed during the hospital encounter of 01/25/15 (from the past 48 hour(s))  CBC     Status: None   Collection Time: 01/24/15 10:55 PM  Result Value Ref Range   WBC 10.4 4.0 - 10.5 K/uL   RBC 5.11 4.22 - 5.81 MIL/uL   Hemoglobin 14.8 13.0 - 17.0 g/dL   HCT 43.2 39.0 - 52.0 %   MCV 84.5 78.0 - 100.0 fL   MCH 29.0 26.0 - 34.0 pg   MCHC 34.3 30.0 - 36.0 g/dL   RDW 13.3 11.5 - 15.5 %   Platelets 232 150 - 400 K/uL  Basic metabolic panel     Status: Abnormal   Collection Time: 01/24/15 10:55 PM  Result Value Ref Range   Sodium 135 135 - 145 mmol/L   Potassium 4.0 3.5 - 5.1 mmol/L   Chloride 99 (L) 101 - 111 mmol/L   CO2 27 22 - 32 mmol/L   Glucose, Bld 108 (H) 65 - 99 mg/dL   BUN 13 6 - 20 mg/dL   Creatinine, Ser 0.98 0.61 - 1.24 mg/dL   Calcium 8.5 (L) 8.9 - 10.3 mg/dL   GFR calc non Af Amer >60 >60 mL/min   GFR calc Af Amer >60 >60 mL/min    Comment: (NOTE) The eGFR has been calculated using the CKD EPI equation. This calculation has not been validated in all clinical situations. eGFR's persistently <60 mL/min signify possible Chronic Kidney Disease.    Anion gap 9 5 - 15    [place] Extrem Up Right Ltd  01/25/2015    CLINICAL DATA:  Right arm cellulitis  EXAM: ULTRASOUND right UPPER EXTREMITY LIMITED  TECHNIQUE: Ultrasound examination of the upper extremity soft tissues was performed in the area of clinical concern.  COMPARISON:  Radiographs 01/25/15  FINDINGS: There is a 2.1 x 0.9 x 2.8 cm collection in the subcutaneous tissues of the right upper extremity, corresponding to the area of concern. The collection has irregular mural thickening. No significant vascularity within the lumen of the collection. This is suspicious for an abscess.  IMPRESSION: Irregular soft tissue collection in the area of concern in the right upper extremity, likely representing an abscess measuring roughly 1 x 2 x 3 cm.   Electronically Signed   By: Andreas Newport M.D.   On: 01/25/2015 03:44   Dg Humerus Right  01/25/2015   CLINICAL DATA:  Right arm pain and swelling, after injecting testosterone. Initial encounter.  EXAM: RIGHT HUMERUS - 2+ VIEW  COMPARISON:  None.  FINDINGS: There is no evidence of osseous erosion. The right humerus appears intact. The soft tissues are not well assessed on radiograph. No soft tissue air is seen. The humeral head remains  seated at the glenoid fossa. The right acromioclavicular joint is unremarkable.  IMPRESSION: No evidence of osseous erosion.  No soft tissue air seen.   Electronically Signed   By: Garald Balding M.D.   On: 01/25/2015 03:23    Review of Systems  All other systems reviewed and are negative.  Blood pressure 116/77, pulse 72, temperature 98.5 F (36.9 C), temperature source Oral, resp. rate 15, height 5' 10"  (1.778 m), weight 73.664 kg (162 lb 6.4 oz), SpO2 99 %. Physical Exam  On examination patient has redness cellulitis of the right triceps area. Review of his ultrasound shows a deep abscess. Assessment/Plan: Assessment abscess right triceps mid aspect of the arm.  Plan: We'll plan for debridement of the abscess this afternoon. Risks and benefits were discussed including  neurovascular injury persistent infection and need for additional surgery. Patient states he understands and wishes to proceed at this time.  Brown,Johnathan V 01/25/2015, 7:17 AM

## 2015-01-25 NOTE — Progress Notes (Signed)
Patient left floor for OR with OR nurse for planned procedure.

## 2015-01-25 NOTE — Care Management Note (Signed)
Case Management Note  Patient Details  Name: Johnathan Brown MRN: [id number] Date of Birth: 18-Jun-1975  Subjective/Objective:                  Date-01-25-15 Tuesday Initial Assessment Spoke with patient at the bedside along with family.  Introduced self as Sports coach and explained role in discharge planning and how to be reached.  Verified patient lives at [address] [place] [address], lives with mother Johnathan Brown [phone].  Verified patient anticipates to go home with family, at time of discharge and will have part-time supervision by family at this time to best of their knowledge.  Patient has no DME. Expressed potential need for no other DME.  Patient confirmed  needing help with their medication.  Patient received pamphlet from [facility] and [facility]. CM explained to patient that they may use the on site pharmacy to fill prescriptions given to them at discharge. Patient aware that the [facility] pharmacy will not fill narcotics or pain medications prior to the patient being seen by one of their physicians.  Patient aware that they must be seen as a patient prior to the pharmacy filling the prescriptions a second time.  Follow up at [facility] entered into AVS. Patient drives to MD appointments.  Verified patient has no PCP, will F/U at [facility]. Patient states they currently receive HH services through no one.   Plan: CM will continue to follow for discharge planning and [facility] resources.   Johnathan Sabal RN BSN CM [phone]   Action/Plan:   Expected Discharge Date:                  Expected Discharge Plan:  Home/Self Care  In-House Referral:     Discharge planning Services  CM Consult  Post Acute Care Choice:    Choice offered to:     DME Arranged:    DME Agency:     HH Arranged:    HH Agency:     Status of Service:  In process, will continue to follow  Medicare Important Message Given:    Date Medicare IM  Given:    Medicare IM give by:    Date Additional Medicare IM Given:    Additional Medicare Important Message give by:     If discussed at Long Length of Stay Meetings, dates discussed:    Additional Comments:  Johnathan Sabal, RN 01/25/2015, 11:00 AM
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
## 2015-01-25 NOTE — Progress Notes (Signed)
Spoke with patient at the bedside to give map for [facility]. Patient was laying in bed, looked flushed and did not engage in conversation easily like he had earlier in the day. CM asked patient if he was ok, asking if he had a fever. Patient stated that he thought he was detoxing. He shared that he last used Heroin at 7pm last night and has been using it 2-3 times a day for the past 5 days intravenously. He stated that he sometimes uses suboxitan, but finds it hard at times to stay off Heroin. He stated that he usually has 5 episodes a year where he goes back to IV Heroin. CM asked patient when his last HIV screen was, he said it was not recently, and no results found in Epic. Due to IV drug use and patient being admitted for abscess to upper arm site where he injected steroids CM suggested HIV screen to bedside nurse Jennifer/ and charge nurse Ginger.

## 2015-01-25 NOTE — ED Notes (Signed)
Patient is resting comfortably. 

## 2015-01-25 NOTE — Progress Notes (Signed)
ANTIBIOTIC CONSULT NOTE - INITIAL  Pharmacy Consult for zosyn and vancomycin Indication: RUE abscess  No Known Allergies  Patient Measurements: Height:  (177.8 cm) Weight: 162 lb 6.4 oz (73.664 kg) IBW/kg (Calculated) : 73  Vital Signs: Temp: 98.5 F (36.9 C) (10/04 0703) BP: 116/77 mmHg (10/04 0703) Pulse Rate: 72 (10/04 0703) Intake/Output from previous day:   Intake/Output from this shift:    Labs:  Recent Labs  01/24/15 2255  WBC 10.4  HGB 14.8  PLT 232  CREATININE 0.98   Estimated Creatinine Clearance: 106.6 mL/min (by C-G formula based on Cr of 0.98). No results for input(s): VANCOTROUGH, VANCOPEAK, VANCORANDOM, GENTTROUGH, GENTPEAK, GENTRANDOM, TOBRATROUGH, TOBRAPEAK, TOBRARND, AMIKACINPEAK, AMIKACINTROU, AMIKACIN in the last 72 hours.   Medical History: Past Medical History  Diagnosis Date  . GERD (gastroesophageal reflux disease)   . Abscess 01/2015   Assessment: 39 yo male injected non-rx testosterone 4 days prior. Noticed pain/swelling in injection site  PMH: Chronic low back pain on suboxone  ID: abscess of RUE due to test. Injections. I&D planned 10/4. WBC 10.4, AF  Vancomycin 10/4>> Zosyn 10/4>>  10/4 Blood x 2  Renal: SCr 0.98  Goal of Therapy:  Vancomycin trough level 15-20 mcg/ml  Plan:  Continue vancomycin 1 gm q8h Zosyn 3.375 gm IV q8h Monitor clinical status, culture data, VT prn F/U with abx plans post I&D  Isaac Bliss, PharmD, BCPS Clinical Pharmacist Pager [phone] 01/25/2015 10:54 AM

## 2015-01-25 NOTE — ED Notes (Signed)
Perimeter of redness demarked with skin marker per MD order

## 2015-01-25 NOTE — ED Provider Notes (Signed)
CSN: [id number]   Arrival date & time 01/24/15 2231  History  By signing my name below, I, Bethel Born, attest that this documentation has been prepared under the direction and in the presence of Derwood Kaplan, MD. Electronically Signed: Bethel Born, ED Scribe. 01/25/2015. 2:58 AM.  Chief Complaint  Patient presents with  . Arm Pain    HPI The history is provided by the patient. No language interpreter was used.   GUS LITTLER is a 39 y.o. male who presents to the Emergency Department complaining of an area of pain, swelling, and redness at the right upper arm with onset 2 days ago. The pt injected testosterone in the right upper arm 3 days ago. He notes that the redness has spread down the arm over the last day.  Pt denies nausea, vomiting, fever, and chills. Right hand dominant. Healthy otherwise. Currently on Suboxone. No history of IV drug use   History reviewed. No pertinent past medical history.  History reviewed. No pertinent past surgical history.  No family history on file.  Social History  Substance Use Topics  . Smoking status: Current Every Day Smoker  . Smokeless tobacco: None  . Alcohol Use: No     Review of Systems  Constitutional: Negative for fever and chills.  Cardiovascular: Negative for chest pain.  Gastrointestinal: Negative for nausea and vomiting.  Musculoskeletal:       An area of redness, pain, and swelling at the right upper arm  Neurological: Negative for weakness.  All other systems reviewed and are negative.  Home Medications   Prior to Admission medications   Medication Sig Start Date End Date Taking? Authorizing Provider  cephALEXin (KEFLEX) 500 MG capsule Take 500 mg by mouth 4 (four) times daily.   Yes Historical Provider, MD  SUBOXONE 8-2 MG FILM Place 0.5-1.5 Film under the tongue 2 (two) times daily. 1.5 films in the morning and 0.5 in the evening 12/25/14  Yes Historical Provider, MD    Allergies  Review of patient's  allergies indicates no known allergies.  Triage Vitals: BP 130/89 mmHg  Pulse 90  Temp(Src) 98.3 F (36.8 C) (Oral)  Resp 18  Ht  (1.778 m)  Wt 162 lb 6.4 oz (73.664 kg)  BMI 23.30 kg/m2  SpO2 97%  Physical Exam  Constitutional: He is oriented to person, place, and time. He appears well-developed and well-nourished. No distress.  HENT:  Head: Normocephalic and atraumatic.  Eyes: Conjunctivae and EOM are normal.  Neck: Neck supple. No tracheal deviation present.  Cardiovascular: Normal rate.   Pulmonary/Chest: Effort normal. No respiratory distress.  Musculoskeletal: Normal range of motion.  Neurological: He is alert and oriented to person, place, and time.  Skin: Skin is warm and dry.  Psychiatric: He has a normal mood and affect. His behavior is normal.  Nursing note and vitals reviewed.   ED Course  Procedures   DIAGNOSTIC STUDIES: Oxygen Saturation is 97% on RA, normal by my interpretation.    COORDINATION OF CARE: 2:42 AM Discussed treatment plan which includes XR, lab work, and abx with pt at bedside and pt agreed to plan.  Labs Reviewed  BASIC METABOLIC PANEL - Abnormal; Notable for the following:    Chloride 99 (*)    Glucose, Bld 108 (*)    Calcium 8.5 (*)    All other components within normal limits  CBC    Imaging Review [place] Extrem Up Right Ltd  01/25/2015   CLINICAL DATA:  Right arm cellulitis  EXAM: ULTRASOUND right UPPER EXTREMITY LIMITED  TECHNIQUE: Ultrasound examination of the upper extremity soft tissues was performed in the area of clinical concern.  COMPARISON:  Radiographs 01/25/15  FINDINGS: There is a 2.1 x 0.9 x 2.8 cm collection in the subcutaneous tissues of the right upper extremity, corresponding to the area of concern. The collection has irregular mural thickening. No significant vascularity within the lumen of the collection. This is suspicious for an abscess.  IMPRESSION: Irregular soft tissue collection in the area of concern in the  right upper extremity, likely representing an abscess measuring roughly 1 x 2 x 3 cm.   Electronically Signed   By: Ellery Plunk M.D.   On: 01/25/2015 03:44   Dg Humerus Right  01/25/2015   CLINICAL DATA:  Right arm pain and swelling, after injecting testosterone. Initial encounter.  EXAM: RIGHT HUMERUS - 2+ VIEW  COMPARISON:  None.  FINDINGS: There is no evidence of osseous erosion. The right humerus appears intact. The soft tissues are not well assessed on radiograph. No soft tissue air is seen. The humeral head remains seated at the glenoid fossa. The right acromioclavicular joint is unremarkable.  IMPRESSION: No evidence of osseous erosion.  No soft tissue air seen.   Electronically Signed   By: Roanna Raider M.D.   On: 01/25/2015 03:23     I personally reviewed and evaluated these images and lab results as a part of my medical decision-making.    MDM   Final diagnoses:  Right arm cellulitis  Abscess of arm, right    I personally performed the services described in this documentation, which was scribed in my presence. The recorded information has been reviewed and is accurate.   PT comes in with cc of arm pain and swelling. Clinical concerns for phlegmon vs abscess in the R arm with cellulitis. PT is immunocompetent. [place] ordered - and the results heighten the suspicion for an abscess.  Spoke with Orthopedics, they will see the patient as consult. Will admit to medicine - given this is a well spread cellulitis COVERING MOST OF THE dominant RUE of the patient and is a result of needle use.   Derwood Kaplan, MD 01/25/15 [phone]

## 2015-01-25 NOTE — Anesthesia Preprocedure Evaluation (Addendum)
Anesthesia Evaluation  Patient identified by MRN, date of birth, ID band Patient awake    Reviewed: Allergy & Precautions, H&P , NPO status , Patient's Chart, lab work & pertinent test results  Airway Mallampati: III  TM Distance: >3 FB Neck ROM: Full    Dental  (+) Teeth Intact, Dental Advisory Given   Pulmonary Current Smoker,    breath sounds clear to auscultation       Cardiovascular negative cardio ROS   Rhythm:Regular Rate:Normal     Neuro/Psych negative neurological ROS  negative psych ROS   GI/Hepatic GERD  ,(+)     substance abuse  ,   Endo/Other  negative endocrine ROS  Renal/GU negative Renal ROS     Musculoskeletal negative musculoskeletal ROS (+)   Abdominal   Peds  Hematology negative hematology ROS (+)   Anesthesia Other Findings   Reproductive/Obstetrics                          Anesthesia Physical Anesthesia Plan  ASA: II  Anesthesia Plan: General   Post-op Pain Management:    Induction: Intravenous  Airway Management Planned: LMA  Additional Equipment:   Intra-op Plan:   Post-operative Plan: Extubation in OR  Informed Consent: I have reviewed the patients History and Physical, chart, labs and discussed the procedure including the risks, benefits and alternatives for the proposed anesthesia with the patient or authorized representative who has indicated his/her understanding and acceptance.   Dental advisory given  Plan Discussed with: CRNA  Anesthesia Plan Comments:         Anesthesia Quick Evaluation

## 2015-01-25 NOTE — Progress Notes (Signed)
Patient given map with location and contact info for [facility] where [facility] F[facility] is made for the [facility] and [facility].

## 2015-01-25 NOTE — Op Note (Signed)
01/25/2015  9:34 PM  PATIENT:  Johnathan Brown    PRE-OPERATIVE DIAGNOSIS:  Abscess right arm mid substance of the triceps  POST-OPERATIVE DIAGNOSIS:  Same  PROCEDURE:   IRRIGATION AND DEBRIDEMENT RIGHT ARM, placement Penrose drain  SURGEON:  Nadara Mustard, MD  PHYSICIAN ASSISTANT:None ANESTHESIA:   General  PREOPERATIVE INDICATIONS:  [name] is a  39 y.o. male with a diagnosis of Infected right arm who failed conservative measures and elected for surgical management.    The risks benefits and alternatives were discussed with the patient preoperatively including but not limited to the risks of infection, bleeding, nerve injury, cardiopulmonary complications, the need for revision surgery, among others, and the patient was willing to proceed.  OPERATIVE IMPLANTS: Penrose drain  OPERATIVE FINDINGS: Purulent abscess cultures obtained 2 patient is on vancomycin and Zosyn preoperatively  OPERATIVE PROCEDURE: Patient was brought to the operating room and underwent a general and aesthetic. After adequate levels anesthesia were obtained patient's right upper extremity was prepped using DuraPrep draped into a sterile field a timeout was called. A 3 cm incision was made longitudinally over the triceps. This was carried down to a purulent abscess, cultures were obtained 2, this was irrigated with normal saline, fascia and soft tissue was excised. There is no necrotic tissue. The wound was closed over a Penrose drain with 2-0 nylon. A sterile compressive dressing was applied. Patient was extubated taken to the PACU in stable condition.

## 2015-01-25 NOTE — Progress Notes (Signed)
Received report on patient from Huntley Dec, ED nurse.

## 2015-01-25 NOTE — Anesthesia Procedure Notes (Signed)
Procedure Name: LMA Insertion Date/Time: 01/25/2015 9:16 PM Performed by: Adonis Housekeeper Pre-anesthesia Checklist: Patient identified, Emergency Drugs available, Suction available and Patient being monitored Patient Re-evaluated:Patient Re-evaluated prior to inductionOxygen Delivery Method: Circle system utilized Preoxygenation: Pre-oxygenation with 100% oxygen Intubation Type: IV induction Ventilation: Mask ventilation without difficulty LMA: LMA inserted LMA Size: 4.0 Number of attempts: 1 Tube secured with: Tape Dental Injury: Teeth and Oropharynx as per pre-operative assessment

## 2015-01-25 NOTE — Progress Notes (Signed)
Pt informed that he will not have surgery until later tonight.  Pt and mother became very upset and pt stated "I can't stay here.  I've had 24 hours of antibiotics and I can't starve another day.  I'm leaving."  Dr. Susie Cassette informed.  Informed pt that it is possible to develop a severe infection in this blood stream and that if he develops any fevers or changes in mental status, he needs to come to the hospital.  Pt later attempted to leave while IV still in arm. Stopped at nurse's station and escorted back to room.  Charge nurse, Ginger Gleson, removed IV.  AMA paperwork signed.  Will continue to monitor.

## 2015-01-26 ENCOUNTER — Encounter (HOSPITAL_COMMUNITY): Payer: Self-pay | Admitting: Orthopedic Surgery

## 2015-01-26 DIAGNOSIS — G8929 Other chronic pain: Secondary | ICD-10-CM

## 2015-01-26 DIAGNOSIS — M545 Low back pain: Secondary | ICD-10-CM

## 2015-01-26 DIAGNOSIS — Z720 Tobacco use: Secondary | ICD-10-CM

## 2015-01-26 DIAGNOSIS — F1910 Other psychoactive substance abuse, uncomplicated: Secondary | ICD-10-CM

## 2015-01-26 DIAGNOSIS — L02413 Cutaneous abscess of right upper limb: Principal | ICD-10-CM

## 2015-01-26 LAB — BASIC METABOLIC PANEL
ANION GAP: 9 (ref 5–15)
BUN: 9 mg/dL (ref 6–20)
CALCIUM: 8.5 mg/dL — AB (ref 8.9–10.3)
CHLORIDE: 102 mmol/L (ref 101–111)
CO2: 26 mmol/L (ref 22–32)
CREATININE: 0.96 mg/dL (ref 0.61–1.24)
GFR calc non Af Amer: >60 mL/min (ref >60)
Glucose, Bld: 116 mg/dL — ABNORMAL HIGH (ref 65–99)
Potassium: 4 mmol/L (ref 3.5–5.1)
SODIUM: 137 mmol/L (ref 135–145)

## 2015-01-26 LAB — CBC
HCT: 45.1 % (ref 39.0–52.0)
HEMOGLOBIN: 15.2 g/dL (ref 13.0–17.0)
MCH: 28.8 pg (ref 26.0–34.0)
MCHC: 33.7 g/dL (ref 30.0–36.0)
MCV: 85.4 fL (ref 78.0–100.0)
PLATELETS: 256 10*3/uL (ref 150–400)
RBC: 5.28 MIL/uL (ref 4.22–5.81)
RDW: 13.7 % (ref 11.5–15.5)
WBC: 13.4 10*3/uL — AB (ref 4.0–10.5)

## 2015-01-26 LAB — VANCOMYCIN, TROUGH: Vancomycin Tr: 14 ug/mL (ref 10.0–20.0)

## 2015-01-26 LAB — HIV ANTIBODY (ROUTINE TESTING W REFLEX): HIV Screen 4th Generation wRfx: NONREACTIVE

## 2015-01-26 MED ORDER — FLORANEX PO PACK
1.0000 g | PACK | Freq: Three times a day (TID) | ORAL | Status: DC
  Administered 2015-01-27 – 2015-01-28 (×5): 1 g via ORAL
  Filled 2015-01-26 (×12): qty 1

## 2015-01-26 MED ORDER — MORPHINE SULFATE (PF) 2 MG/ML IV SOLN
1.0000 mg | INTRAVENOUS | Status: DC | PRN
  Administered 2015-01-26 (×7): 1 mg via INTRAVENOUS
  Filled 2015-01-26 (×6): qty 1

## 2015-01-26 MED ORDER — KETOROLAC TROMETHAMINE 15 MG/ML IJ SOLN
15.0000 mg | Freq: Three times a day (TID) | INTRAMUSCULAR | Status: DC | PRN
  Administered 2015-01-26 – 2015-01-27 (×3): 15 mg via INTRAVENOUS
  Filled 2015-01-26 (×4): qty 1

## 2015-01-26 MED ORDER — OXYCODONE-ACETAMINOPHEN 5-325 MG PO TABS
1.0000 | ORAL_TABLET | Freq: Three times a day (TID) | ORAL | Status: DC | PRN
  Administered 2015-01-26 – 2015-01-28 (×3): 1 via ORAL
  Filled 2015-01-26 (×5): qty 1

## 2015-01-26 NOTE — Progress Notes (Signed)
ANTIBIOTIC CONSULT NOTE - FOLLOW UP  Pharmacy Consult for vancomycin and Zosyn Indication: RUE abscess  No Known Allergies  Patient Measurements: Height:  (177.8 cm) Weight: 162 lb 6.4 oz (73.664 kg) IBW/kg (Calculated) : 73 Adjusted Body Weight:   Vital Signs: Temp: 98.2 F (36.8 C) (10/05 0715) Temp Source: Oral (10/05 0715) BP: 117/71 mmHg (10/05 0715) Pulse Rate: 74 (10/05 0715) Intake/Output from previous day: 10/04 0701 - 10/05 0700 In: 1000 [I.V.:750; IV Piggyback:250] Out: 650 [Urine:650] Intake/Output from this shift: Total I/O In: 220 [P.O.:220] Out: -   Labs:  Recent Labs  01/24/15 2255 01/25/15 1147 01/26/15 0556  WBC 10.4 9.1 13.4*  HGB 14.8 16.3 15.2  PLT 232 264 256  CREATININE 0.98 0.82 0.96   Estimated Creatinine Clearance: 108.8 mL/min (by C-G formula based on Cr of 0.96).  Recent Labs  01/26/15 1208  VANCOTROUGH 14     Microbiology: Recent Results (from the past 720 hour(s))  Culture, blood (routine x 2)     Status: None (Preliminary result)   Collection Time: 01/25/15  9:38 AM  Result Value Ref Range Status   Specimen Description BLOOD RIGHT ANTECUBITAL  Final   Special Requests BOTTLES DRAWN AEROBIC AND ANAEROBIC 5CC  Final   Culture NO GROWTH 1 DAY  Final   Report Status PENDING  Incomplete  Culture, blood (routine x 2)     Status: None (Preliminary result)   Collection Time: 01/25/15  9:44 AM  Result Value Ref Range Status   Specimen Description BLOOD LEFT ANTECUBITAL  Final   Special Requests BOTTLES DRAWN AEROBIC AND ANAEROBIC 10CC  Final   Culture NO GROWTH 1 DAY  Final   Report Status PENDING  Incomplete  Surgical pcr screen     Status: Abnormal   Collection Time: 01/25/15 11:47 AM  Result Value Ref Range Status   MRSA, PCR NEGATIVE NEGATIVE Final   Staphylococcus aureus POSITIVE (A) NEGATIVE Final    Comment:        The Xpert SA Assay (FDA approved for NASAL specimens in patients over 63 years of age), is one  component of a comprehensive surveillance program.  Test performance has been validated by [facility] for patients greater than or equal to 77 year old. It is not intended to diagnose infection nor to guide or monitor treatment.   Anaerobic culture     Status: None (Preliminary result)   Collection Time: 01/25/15  9:24 PM  Result Value Ref Range Status   Specimen Description ABSCESS RIGHT ARM  Final   Special Requests NONE  Final   Gram Stain   Final    FEW WBC PRESENT, PREDOMINANTLY PMN NO SQUAMOUS EPITHELIAL CELLS SEEN NO ORGANISMS SEEN Performed at [facility]    Culture PENDING  Incomplete   Report Status PENDING  Incomplete  Culture, routine-abscess     Status: None (Preliminary result)   Collection Time: 01/25/15  9:24 PM  Result Value Ref Range Status   Specimen Description ABSCESS RIGHT ARM  Final   Special Requests NONE  Final   Gram Stain   Final    FEW WBC PRESENT, PREDOMINANTLY PMN NO SQUAMOUS EPITHELIAL CELLS SEEN NO ORGANISMS SEEN Performed at [facility]    Culture NO GROWTH Performed at [facility]   Final   Report Status PENDING  Incomplete    Anti-infectives    Start     Dose/Rate Route Frequency Ordered Stop   01/25/15 1200  vancomycin (VANCOCIN) IVPB  1000 mg/200 mL premix     1,000 mg 200 mL/hr over 60 Minutes Intravenous Every 8 hours 01/25/15 0717     01/25/15 1000  piperacillin-tazobactam (ZOSYN) IVPB 3.375 g     3.375 g 12.5 mL/hr over 240 Minutes Intravenous Every 8 hours 01/25/15 0929     01/25/15 0400  vancomycin (VANCOCIN) IVPB 1000 mg/200 mL premix     1,000 mg 200 mL/hr over 60 Minutes Intravenous  Once 01/25/15 0352 01/25/15 0600      Assessment: 39 y/o male who injected no-Rx testosterone 4 days prior then noticed pain and swelling at the injection site. He was admitted and started on vancomycin and Zosyn. He went to OR for I&D on 10/4. Renal function is stable, WBC up to 13.4, but remain afebrile.  Cultures are ngtd. Vancomycin trough is 14 - likely to accumulate on q8h dosing so will continue this dose for now.  Goal of Therapy:  Vancomycin trough level 15-20 mcg/ml  Plan:  - Continue vancomycin 1000 mg IV q8h - Zosyn 3.375 g IV q8h to be infused over 4 hours - Monitor renal function, clinical progress, and culture data  [facility], [place].D., BCPS Clinical Pharmacist Pager: [phone] 01/26/2015 2:13 PM

## 2015-01-26 NOTE — Evaluation (Signed)
Physical Therapy Evaluation Patient Details Name: Johnathan Brown MRN: [id number] DOB: [birth year] Today's Date: 01/26/2015   History of Present Illness  Pt adm with abscess of rt arm. Underwent I&D on 01/25/15. Pt reports heroin injections. PMH - back pain  Clinical Impression  Pt doing well with mobility and no further PT needed.  Ready for dc from PT standpoint. Instructed pt in keeping RUE elevated with pillows to reduce edema and pain. Also instructed pt in importance of active range of motion of hand, wrist, elbow and shoulder.      Follow Up Recommendations No PT follow up    Equipment Recommendations  None recommended by PT    Recommendations for Other Services       Precautions / Restrictions Precautions Precautions: None      Mobility  Bed Mobility Overal bed mobility: Independent                Transfers Overall transfer level: Independent                  Ambulation/Gait Ambulation/Gait assistance: Independent Ambulation Distance (Feet): 500 Feet Assistive device: None Gait Pattern/deviations: WFL(Within Functional Limits)   Gait velocity interpretation: at or above normal speed for age/gender    Stairs            Wheelchair Mobility    Modified Rankin (Stroke Patients Only)       Balance Overall balance assessment: No apparent balance deficits (not formally assessed)                                           Pertinent Vitals/Pain Pain Assessment: Faces Faces Pain Scale: Hurts little more Pain Location: rt arm Pain Descriptors / Indicators: Pressure Pain Intervention(s): Monitored during session;Repositioned (elevated on pillows)    Home Living Family/patient expects to be discharged to:: Private residence Living Arrangements: Parent             Home Equipment: None      Prior Function Level of Independence: Independent               Hand Dominance   Dominant Hand: Right     Extremity/Trunk Assessment   Upper Extremity Assessment: RUE deficits/detail RUE Deficits / Details: decr elbow flexion due to pain          Lower Extremity Assessment: Overall WFL for tasks assessed      Cervical / Trunk Assessment: Normal  Communication   Communication: No difficulties  Cognition Arousal/Alertness: Awake/alert Behavior During Therapy: WFL for tasks assessed/performed Overall Cognitive Status: Within Functional Limits for tasks assessed                      General Comments      Exercises        Assessment/Plan    PT Assessment Patent does not need any further PT services  PT Diagnosis Other (comment)   PT Problem List    PT Treatment Interventions     PT Goals (Current goals can be found in the Care Plan section) Acute Rehab PT Goals PT Goal Formulation: All assessment and education complete, DC therapy    Frequency     Barriers to discharge        Co-evaluation               End of Session   Activity Tolerance:  Patient tolerated treatment well Patient left: in bed;with call bell/phone within reach;with nursing/sitter in room Nurse Communication: Mobility status;Other (comment) (positioning of RUE)         Time: 0902-0920 PT Time Calculation (min) (ACUTE ONLY): 18 min   Charges:   PT Evaluation $Initial PT Evaluation Tier I: 1 Procedure     PT G Codes:        Nelwyn Hebdon 02/15/15, 10:44 AM Skip Mayer PT [phone]

## 2015-01-26 NOTE — Progress Notes (Signed)
Patient ID: Johnathan Brown, male   DOB: Dec 27, 1975, 39 y.o.   MRN: [id number] Postoperative day 1 status post debridement abscess right triceps. Patient did have a purulent abscess. Cultures were sent however I doubt these will show any positive results due to his IV antibiotics. Recommend several additional days of IV antibiotics anticipate discharge to home on doxycycline for 1 month pending the results of the cultures. Nursing to change the dressing and remove the Penrose drain prior to discharge.

## 2015-01-26 NOTE — Progress Notes (Signed)
PROGRESS NOTE  Johnathan Brown ZOX:[id number] DOB: 01-31-1976 DOA: 01/25/2015 PCP: No primary care provider on file.  HPI/Recap of past 24 hours: C/o right upper arm pain, feeling dressing is too tight,  Erythema has subsided,  C/o pain is not well controlled  Assessment/Plan: Principal Problem:   Abscess of right upper extremity Active Problems:   Chronic low back pain   Substance abuse   Tobacco abuse   Abscess of arm, right   Right arm cellulitis   Abscess of upper extremity Abscess of right upper extremity -Orthopedics has evaluated and s/p intraoperative I/D on 10/4 -Empiric vancomycin with pharmacy dosing/zosyn - blood cultures no growth  Active Problems:  Chronic low back pain -Patient reports takes Suboxone, usually not every day and dosage can vary from one half to one tablet when taken -Somewhat evasive regarding prescribing provider stating "I go to a doctor in [place]" when asked where he gets his medication from since he states he does not have a primary care provider -Suboxone not on formulary here and in setting of acute pain (see above) was utilize oxycodone and IV morphine   Substance abuse including heroin -Patient admitted to injecting illegally obtained testosterone as adjunct to muscle building/bodybuilding for recent weight loss -Counseled against using such medication -Denies IV drug use or reusing his testosterone injection needles -urine drug screen + amphetamine, benzo, opiates, thc   Tobacco abuse -Nicotine patch -Cessation counseling provided   Code Status: full  Family Communication: patient   Disposition Plan: home in a few days   Consultants:  Orthopedics Dr. Lajoyce Corners  Procedures: IRRIGATION AND DEBRIDEMENT RIGHT ARM, placement Penrose drain on 10/4   Antibiotics:  Vanc/zosyn from admission   Objective: BP 117/71 mmHg  Pulse 74  Temp(Src) 98.2 F (36.8 C) (Oral)  Resp 14  Ht 5\' 10"  (1.778 m)  Wt 162 lb 6.4 oz (73.664  kg)  BMI 23.30 kg/m2  SpO2 97%  Intake/Output Summary (Last 24 hours) at 01/26/15 1212 Last data filed at 01/26/15 0401  Gross per 24 hour  Intake    950 ml  Output    650 ml  Net    300 ml   Filed Weights   01/24/15 2242  Weight: 162 lb 6.4 oz (73.664 kg)    Exam:   General:  NAD  Cardiovascular: RRR  Respiratory: CTABL  Abdomen: Soft/ND/NT, positive BS  Musculoskeletal: right upper arm post op changes, dressing intact, no erythema, good peripheral pulse  Neuro: aaox3  Data Reviewed: Basic Metabolic Panel:  Recent Labs Lab 01/24/15 2255 01/25/15 1147 01/26/15 0556  NA 135 139 137  K 4.0 4.2 4.0  CL 99* 101 102  CO2 27 27 26   GLUCOSE 108* 90 116*  BUN 13 11 9   CREATININE 0.98 0.82 0.96  CALCIUM 8.5* 9.2 8.5*   Liver Function Tests:  Recent Labs Lab 01/25/15 1147  AST 22  ALT 13*  ALKPHOS 77  BILITOT 0.9  PROT 6.7  ALBUMIN 3.7   No results for input(s): LIPASE, AMYLASE in the last 168 hours. No results for input(s): AMMONIA in the last 168 hours. CBC:  Recent Labs Lab 01/24/15 2255 01/25/15 1147 01/26/15 0556  WBC 10.4 9.1 13.4*  NEUTROABS  --  6.1  --   HGB 14.8 16.3 15.2  HCT 43.2 48.4 45.1  MCV 84.5 84.8 85.4  PLT 232 264 256   Cardiac Enzymes:   No results for input(s): CKTOTAL, CKMB, CKMBINDEX, TROPONINI in the last 168 hours. BNP (  last 3 results) No results for input(s): BNP in the last 8760 hours.  ProBNP (last 3 results) No results for input(s): PROBNP in the last 8760 hours.  CBG: No results for input(s): GLUCAP in the last 168 hours.  Recent Results (from the past 240 hour(s))  Culture, blood (routine x 2)     Status: None (Preliminary result)   Collection Time: 01/25/15  9:38 AM  Result Value Ref Range Status   Specimen Description BLOOD RIGHT ANTECUBITAL  Final   Special Requests BOTTLES DRAWN AEROBIC AND ANAEROBIC 5CC  Final   Culture NO GROWTH 1 DAY  Final   Report Status PENDING  Incomplete  Culture, blood  (routine x 2)     Status: None (Preliminary result)   Collection Time: 01/25/15  9:44 AM  Result Value Ref Range Status   Specimen Description BLOOD LEFT ANTECUBITAL  Final   Special Requests BOTTLES DRAWN AEROBIC AND ANAEROBIC 10CC  Final   Culture NO GROWTH 1 DAY  Final   Report Status PENDING  Incomplete  Surgical pcr screen     Status: Abnormal   Collection Time: 01/25/15 11:47 AM  Result Value Ref Range Status   MRSA, PCR NEGATIVE NEGATIVE Final   Staphylococcus aureus POSITIVE (A) NEGATIVE Final    Comment:        The Xpert SA Assay (FDA approved for NASAL specimens in patients over 34 years of age), is one component of a comprehensive surveillance program.  Test performance has been validated by [facility] for patients greater than or equal to 73 year old. It is not intended to diagnose infection nor to guide or monitor treatment.   Anaerobic culture     Status: None (Preliminary result)   Collection Time: 01/25/15  9:24 PM  Result Value Ref Range Status   Specimen Description ABSCESS RIGHT ARM  Final   Special Requests NONE  Final   Gram Stain   Final    FEW WBC PRESENT, PREDOMINANTLY PMN NO SQUAMOUS EPITHELIAL CELLS SEEN NO ORGANISMS SEEN Performed at [facility]    Culture PENDING  Incomplete   Report Status PENDING  Incomplete  Culture, routine-abscess     Status: None (Preliminary result)   Collection Time: 01/25/15  9:24 PM  Result Value Ref Range Status   Specimen Description ABSCESS RIGHT ARM  Final   Special Requests NONE  Final   Gram Stain   Final    FEW WBC PRESENT, PREDOMINANTLY PMN NO SQUAMOUS EPITHELIAL CELLS SEEN NO ORGANISMS SEEN Performed at [facility]    Culture NO GROWTH Performed at [facility]   Final   Report Status PENDING  Incomplete     Studies: No results found.  Scheduled Meds: . antiseptic oral rinse  7 mL Mouth Rinse BID  . Chlorhexidine Gluconate Cloth  6 each Topical Daily  . enoxaparin  (LOVENOX) injection  40 mg Subcutaneous Q24H  . lactobacillus  1 g Oral TID WC  . mupirocin ointment  1 application Nasal BID  . nicotine  21 mg Transdermal Daily  . piperacillin-tazobactam (ZOSYN)  IV  3.375 g Intravenous Q8H  . vancomycin  1,000 mg Intravenous Q8H    Continuous Infusions: . sodium chloride 10 mL/hr at 01/25/15 2301  . dextrose 5 % and 0.9 % NaCl with KCl 20 mEq/L Stopped (01/25/15 2300)     Time spent:  Muranda Coye MD, PhD  [facility] Pager [phone]. If 7PM-7AM, please contact night-coverage at [email or web address], password [facility] 01/26/2015,  12:12 PM  LOS: 1 day

## 2015-01-27 ENCOUNTER — Inpatient Hospital Stay (HOSPITAL_COMMUNITY): Payer: Self-pay

## 2015-01-27 LAB — CBC WITH DIFFERENTIAL/PLATELET
BASOS ABS: 0 10*3/uL (ref 0.0–0.1)
BASOS PCT: 0 %
EOS ABS: 0.2 10*3/uL (ref 0.0–0.7)
EOS PCT: 1 %
HCT: 41.3 % (ref 39.0–52.0)
Hemoglobin: 13.7 g/dL (ref 13.0–17.0)
LYMPHS PCT: 10 %
Lymphs Abs: 1.4 10*3/uL (ref 0.7–4.0)
MCH: 28.5 pg (ref 26.0–34.0)
MCHC: 33.2 g/dL (ref 30.0–36.0)
MCV: 85.9 fL (ref 78.0–100.0)
MONO ABS: 1.5 10*3/uL — AB (ref 0.1–1.0)
Monocytes Relative: 11 %
Neutro Abs: 10.2 10*3/uL — ABNORMAL HIGH (ref 1.7–7.7)
Neutrophils Relative %: 78 %
PLATELETS: 241 10*3/uL (ref 150–400)
RBC: 4.81 MIL/uL (ref 4.22–5.81)
RDW: 13.8 % (ref 11.5–15.5)
WBC: 13.2 10*3/uL — AB (ref 4.0–10.5)

## 2015-01-27 LAB — COMPREHENSIVE METABOLIC PANEL
ALT: 12 U/L — ABNORMAL LOW (ref 17–63)
AST: 16 U/L (ref 15–41)
Albumin: 3.1 g/dL — ABNORMAL LOW (ref 3.5–5.0)
Alkaline Phosphatase: 58 U/L (ref 38–126)
Anion gap: 10 (ref 5–15)
BUN: 8 mg/dL (ref 6–20)
CHLORIDE: 99 mmol/L — AB (ref 101–111)
CO2: 26 mmol/L (ref 22–32)
Calcium: 8.2 mg/dL — ABNORMAL LOW (ref 8.9–10.3)
Creatinine, Ser: 1 mg/dL (ref 0.61–1.24)
GFR calc Af Amer: >60 mL/min (ref >60)
Glucose, Bld: 119 mg/dL — ABNORMAL HIGH (ref 65–99)
POTASSIUM: 4.3 mmol/L (ref 3.5–5.1)
SODIUM: 135 mmol/L (ref 135–145)
Total Bilirubin: 0.7 mg/dL (ref 0.3–1.2)
Total Protein: 5.7 g/dL — ABNORMAL LOW (ref 6.5–8.1)

## 2015-01-27 LAB — TSH: TSH: 0.182 u[IU]/mL — AB (ref 0.350–4.500)

## 2015-01-27 MED ORDER — MORPHINE SULFATE (PF) 2 MG/ML IV SOLN
2.0000 mg | INTRAVENOUS | Status: DC | PRN
  Administered 2015-01-27 – 2015-01-29 (×22): 2 mg via INTRAVENOUS
  Filled 2015-01-27 (×22): qty 1

## 2015-01-27 MED ORDER — MORPHINE SULFATE (PF) 4 MG/ML IV SOLN
4.0000 mg | Freq: Once | INTRAVENOUS | Status: AC
  Administered 2015-01-27: 4 mg via INTRAVENOUS
  Filled 2015-01-27: qty 1

## 2015-01-27 MED ORDER — SENNOSIDES-DOCUSATE SODIUM 8.6-50 MG PO TABS
2.0000 | ORAL_TABLET | Freq: Two times a day (BID) | ORAL | Status: DC
  Administered 2015-01-27 – 2015-01-28 (×2): 2 via ORAL
  Filled 2015-01-27 (×4): qty 2

## 2015-01-27 NOTE — Progress Notes (Signed)
[facility] has doxycycline available at the pharmacy.

## 2015-01-27 NOTE — Progress Notes (Signed)
PROGRESS NOTE  Johnathan Brown ZOX:[id number] DOB: 07/15/1975 DOA: 01/25/2015 PCP: No primary care provider on file.  HPI/Recap of past 24 hours:  C/o right elbow swelling/pain, limited range of motion at right elbow, c/o the drain is irritating, wanting to get it out.   Reported right hands feeling better, full range of motion, good radial pulse.  Ambulating in hallway  Assessment/Plan: Principal Problem:   Abscess of right upper extremity Active Problems:   Chronic low back pain   Substance abuse   Tobacco abuse   Abscess of arm, right   Right arm cellulitis   Abscess of upper extremity  Abscess of right upper extremity -Orthopedics has evaluated and s/p intraoperative I/D on 10/4 -Empiric vancomycin with pharmacy dosing/zosyn - blood cultures no growth, drain culture pending -talked to on call orthopedics Dr. Roda Shutters , recommended infectious disease consult. -patient insist to have penrose drain removed, per RN, no drainage when dressing changed, will remove penrose drain today.  Right elbow pain: on exam, ? Fluids right elbow, but right elbow x ray did not show joint effusion. Prn pain meds.  Active Problems:  Chronic low back pain -Patient reports takes Suboxone, usually not every day and dosage can vary from one half to one tablet when taken -Somewhat evasive regarding prescribing provider stating "I go to a doctor in [place]" when asked where he gets his medication from since he states he does not have a primary care provider -Suboxone not on formulary here and in setting of acute pain (see above) was utilize oxycodone and IV morphine -no complaint about back pain in the hospital, ambulating freely   Substance abuse including heroin -Patient admitted to injecting illegally obtained testosterone as adjunct to muscle building/bodybuilding for recent weight loss, later he admitted he use iv heroin. -urine drug screen + amphetamine, benzo, opiates, thc   Tobacco  abuse -Nicotine patch -Cessation counseling provided   Code Status: full  Family Communication: patient   Disposition Plan: home in a few days   Consultants:  Orthopedics Dr. Lajoyce Corners  Infectious disease Dr. Drue Second  Procedures: IRRIGATION AND DEBRIDEMENT RIGHT ARM, placement Penrose drain on 10/4   Antibiotics:  Vanc/zosyn from admission   Objective: BP 100/67 mmHg  Pulse 85  Temp(Src) 99.6 F (37.6 C) (Oral)  Resp 14  Ht  (1.778 m)  Wt 162 lb 6.4 oz (73.664 kg)  BMI 23.30 kg/m2  SpO2 97%  Intake/Output Summary (Last 24 hours) at 01/27/15 1357 Last data filed at 01/26/15 1900  Gross per 24 hour  Intake    250 ml  Output      0 ml  Net    250 ml   Filed Weights   01/24/15 2242  Weight: 162 lb 6.4 oz (73.664 kg)    Exam:   General:  NAD  Cardiovascular: RRR  Respiratory: CTABL  Abdomen: Soft/ND/NT, positive BS  Musculoskeletal: right upper arm post op changes, dressing intact, no erythema, but dose has edema, bulgy and fluctuant around right elbow, good peripheral pulse  Neuro: aaox3  Data Reviewed: Basic Metabolic Panel:  Recent Labs Lab 01/24/15 2255 01/25/15 1147 01/26/15 0556 01/27/15 0610  NA 135 139 137 135  K 4.0 4.2 4.0 4.3  CL 99* 101 102 99*  CO2 GLUCOSE 108* 90 116* 119*  BUN CREATININE 0.98 0.82 0.96 1.00  CALCIUM 8.5* 9.2 8.5* 8.2*   Liver Function Tests:  Recent Labs Lab 01/25/15 1147  01/27/15 0610  AST 22 16  ALT 13* 12*  ALKPHOS 77 58  BILITOT 0.9 0.7  PROT 6.7 5.7*  ALBUMIN 3.7 3.1*   No results for input(s): LIPASE, AMYLASE in the last 168 hours. No results for input(s): AMMONIA in the last 168 hours. CBC:  Recent Labs Lab 01/24/15 2255 01/25/15 1147 01/26/15 0556 01/27/15 0610  WBC 10.4 9.1 13.4* 13.2*  NEUTROABS  --  6.1  --  10.2*  HGB 14.8 16.3 15.2 13.7  HCT 43.2 48.4 45.1 41.3  MCV 84.5 84.8 85.4 85.9  PLT 232 264 256 241   Cardiac Enzymes:   No results for  input(s): CKTOTAL, CKMB, CKMBINDEX, TROPONINI in the last 168 hours. BNP (last 3 results) No results for input(s): BNP in the last 8760 hours.  ProBNP (last 3 results) No results for input(s): PROBNP in the last 8760 hours.  CBG: No results for input(s): GLUCAP in the last 168 hours.  Recent Results (from the past 240 hour(s))  Culture, blood (routine x 2)     Status: None (Preliminary result)   Collection Time: 01/25/15  9:38 AM  Result Value Ref Range Status   Specimen Description BLOOD RIGHT ANTECUBITAL  Final   Special Requests BOTTLES DRAWN AEROBIC AND ANAEROBIC 5CC  Final   Culture NO GROWTH 2 DAYS  Final   Report Status PENDING  Incomplete  Culture, blood (routine x 2)     Status: None (Preliminary result)   Collection Time: 01/25/15  9:44 AM  Result Value Ref Range Status   Specimen Description BLOOD LEFT ANTECUBITAL  Final   Special Requests BOTTLES DRAWN AEROBIC AND ANAEROBIC 10CC  Final   Culture NO GROWTH 2 DAYS  Final   Report Status PENDING  Incomplete  Surgical pcr screen     Status: Abnormal   Collection Time: 01/25/15 11:47 AM  Result Value Ref Range Status   MRSA, PCR NEGATIVE NEGATIVE Final   Staphylococcus aureus POSITIVE (A) NEGATIVE Final    Comment:        The Xpert SA Assay (FDA approved for NASAL specimens in patients over 7 years of age), is one component of a comprehensive surveillance program.  Test performance has been validated by [facility] for patients greater than or equal to 51 year old. It is not intended to diagnose infection nor to guide or monitor treatment.   Anaerobic culture     Status: None (Preliminary result)   Collection Time: 01/25/15  9:24 PM  Result Value Ref Range Status   Specimen Description ABSCESS RIGHT ARM  Final   Special Requests NONE  Final   Gram Stain   Final    FEW WBC PRESENT, PREDOMINANTLY PMN NO SQUAMOUS EPITHELIAL CELLS SEEN NO ORGANISMS SEEN Performed at [facility]    Culture   Final     NO ANAEROBES ISOLATED; CULTURE IN PROGRESS FOR 5 DAYS Performed at [facility]    Report Status PENDING  Incomplete  Culture, routine-abscess     Status: None (Preliminary result)   Collection Time: 01/25/15  9:24 PM  Result Value Ref Range Status   Specimen Description ABSCESS RIGHT ARM  Final   Special Requests NONE  Final   Gram Stain   Final    FEW WBC PRESENT, PREDOMINANTLY PMN NO SQUAMOUS EPITHELIAL CELLS SEEN NO ORGANISMS SEEN Performed at [facility]    Culture   Final    NO GROWTH 1 DAY Performed at [facility]  Report Status PENDING  Incomplete     Studies: No results found.  Scheduled Meds: . antiseptic oral rinse  7 mL Mouth Rinse BID  . Chlorhexidine Gluconate Cloth  6 each Topical Daily  . enoxaparin (LOVENOX) injection  40 mg Subcutaneous Q24H  . lactobacillus  1 g Oral TID WC  . mupirocin ointment  1 application Nasal BID  . nicotine  21 mg Transdermal Daily  . piperacillin-tazobactam (ZOSYN)  IV  3.375 g Intravenous Q8H  . senna-docusate  2 tablet Oral BID  . vancomycin  1,000 mg Intravenous Q8H    Continuous Infusions: . sodium chloride 10 mL/hr at 01/25/15 2301  . dextrose 5 % and 0.9 % NaCl with KCl 20 mEq/L Stopped (01/25/15 2300)     Time spent: , more than 50% time in discussing with patient about labs, imaging and the plan of care, and incoordination of care, talking to orthopedic surgeon Dr. Roda Shutters and infectious disease Dr. Drue Second.  Kynan Peasley MD, PhD  [facility] Pager [phone]. If 7PM-7AM, please contact night-coverage at [email or web address], password [facility] 01/27/2015, 1:57 PM  LOS: 2 days

## 2015-01-27 NOTE — Progress Notes (Signed)
[facility] notified that pt stating that pain is not being managed explained that pt is requesting that he be awaken if he is sleep to give pain medication. New orders received will continue to monitor, Ilean Skill LPN

## 2015-01-27 NOTE — Progress Notes (Signed)
1620 Patient requested to speak to Dr. Roda Shutters regarding Penrose drain and expected discharge date.  This nurse present with Dr. Roda Shutters during conversation.  Pt adamant that drain be removed, stated "I think the drain is causing me a lot of pain and Dr. Lajoyce Corners told me yesterday that he was going to come back today and take it out this morning."  Dr. Roda Shutters held a lengthy conversation about the need to keep the drain and the importance of receiving IV antibiotics.   Pt also informed that infectious disease will be consulted to figure out what kind of antibiotics will be needed to treat the infection effectively.  Pt stated "I will stay as long as that drain is taken out."  Dr. Roda Shutters told pt that there is a possibility that he would have surgery again if drain is removed and that it may not necessarily be the cause of the pain.  Pt vocalized understanding.  Dr. Roda Shutters ordered for penrose drain to be removed.     1635 Penrose drain removed.  Incision site clean with sutures intact.  Minimal sanguinous drainage present.  Incision covered with gauze. Will continue to monitor patient,  incision and drainage from incision site.

## 2015-01-28 DIAGNOSIS — Z9889 Other specified postprocedural states: Secondary | ICD-10-CM

## 2015-01-28 DIAGNOSIS — F1110 Opioid abuse, uncomplicated: Secondary | ICD-10-CM

## 2015-01-28 DIAGNOSIS — B9689 Other specified bacterial agents as the cause of diseases classified elsewhere: Secondary | ICD-10-CM

## 2015-01-28 LAB — BASIC METABOLIC PANEL
Anion gap: 9 (ref 5–15)
BUN: 9 mg/dL (ref 6–20)
CHLORIDE: 98 mmol/L — AB (ref 101–111)
CO2: 27 mmol/L (ref 22–32)
CREATININE: 0.97 mg/dL (ref 0.61–1.24)
Calcium: 8.4 mg/dL — ABNORMAL LOW (ref 8.9–10.3)
GFR calc non Af Amer: >60 mL/min (ref >60)
Glucose, Bld: 93 mg/dL (ref 65–99)
POTASSIUM: 4.4 mmol/L (ref 3.5–5.1)
SODIUM: 134 mmol/L — AB (ref 135–145)

## 2015-01-28 LAB — CBC
HEMATOCRIT: 40.9 % (ref 39.0–52.0)
HEMOGLOBIN: 13.6 g/dL (ref 13.0–17.0)
MCH: 28.6 pg (ref 26.0–34.0)
MCHC: 33.3 g/dL (ref 30.0–36.0)
MCV: 85.9 fL (ref 78.0–100.0)
Platelets: 244 10*3/uL (ref 150–400)
RBC: 4.76 MIL/uL (ref 4.22–5.81)
RDW: 14 % (ref 11.5–15.5)
WBC: 11.8 10*3/uL — ABNORMAL HIGH (ref 4.0–10.5)

## 2015-01-28 LAB — URIC ACID: URIC ACID, SERUM: 3 mg/dL — AB (ref 4.4–7.6)

## 2015-01-28 NOTE — Consult Note (Signed)
[facility]  Total days of antibiotics 4        Vancomycin 10/4>>        Zosyn 10/4>>               Reason for Consult: Antibiotics for RUE abscess     Referring Physician: [facility]  Principal Problem:   Abscess of right upper extremity Active Problems:   Chronic low back pain   Substance abuse   Tobacco abuse   Abscess of arm, right   Right arm cellulitis   Abscess of upper extremity    HPI: Mr. Awbrey is a 39yo man with PMHx of substance abuse including heroin and chronic low back pain on suboxone who was admitted on 10/4 for RUE swelling and erythema. Patient reported he had self-injected non-prescription testosterone into his right triceps muscle 4 days prior to admission. Later the patient admitted to using heroin, but reports he "sniffs it" and does not inject. A RUE ultrasound showed a 2.1 x 0.9 x 2.8 cm fluid collection consistent with an abscess. He underwent I&D with placement of a penrose drain by orthopedic surgery on 10/4. Blood cultures and wound cultures have growth to date. Patient has been on Vancomycin and Zosyn since admission.   Patient denies any fevers or chills. He reports the swelling and redness have improved since surgery. He states he still has some trouble moving his right arm and that it feels tight.     Past Medical History  Diagnosis Date  . GERD (gastroesophageal reflux disease)   . Abscess 01/2015    Allergies: No Known Allergies  Current antibiotics:   MEDICATIONS: . antiseptic oral rinse  7 mL Mouth Rinse BID  . Chlorhexidine Gluconate Cloth  6 each Topical Daily  . enoxaparin (LOVENOX) injection  40 mg Subcutaneous Q24H  . lactobacillus  1 g Oral TID WC  . mupirocin ointment  1 application Nasal BID  . nicotine  21 mg Transdermal Daily  . piperacillin-tazobactam (ZOSYN)  IV  3.375 g Intravenous Q8H  . senna-docusate  2 tablet Oral BID  . vancomycin  1,000 mg Intravenous Q8H    Social History   Substance Use Topics  . Smoking status: Current Every Day Smoker -- 0.50 packs/day for 15 years    Types: Cigarettes  . Smokeless tobacco: Never Used  . Alcohol Use: No    History reviewed. No pertinent family history.  Review of Systems - General: Denies night sweats, changes in weight, changes in appetite HEENT: Denies headaches, ear pain, changes in vision, rhinorrhea, sore throat CV: Denies CP, palpitations, SOB, orthopnea Pulm: Denies SOB, cough, wheezing GI: Denies abdominal pain, nausea, vomiting, diarrhea, constipation, melena, hematochezia GU: Denies dysuria, hematuria, frequency Msk: Denies muscle cramps, joint pains Neuro: Denies weakness, numbness, tingling Skin: See HPI Psych: Denies depression, anxiety, hallucinations   OBJECTIVE: Temp:  [98.3 F (36.8 C)-98.5 F (36.9 C)] 98.3 F (36.8 C) (10/07 1317) Pulse Rate:  [77-79] 77 (10/07 1317) Resp:  [18] 18 (10/07 1317) BP: (116-121)/(54-59) 121/54 mmHg (10/07 1317) SpO2:  [98 %-100 %] 100 % (10/07 1317) General: sitting up in bed, NAD HEENT: /AT, EOMI, sclera anicteric, mucus membranes moist CV: RRR, no m/g/r Pulm: CTA bilaterally, breaths non-labored Abd: BS+, soft, non-tender Ext: RUE wrapped in ace bandage. RUE appears mildly swollen and tight compared to left side. No erythema appreciated. Patient able to move extremities without limitation in ROM.  Neuro: alert and oriented x 3  LABS: Results for orders placed or performed during the hospital encounter of 01/25/15 (from the past 48 hour(s))  CBC with Differential/Platelet     Status: Abnormal   Collection Time: 01/27/15  6:10 AM  Result Value Ref Range   WBC 13.2 (H) 4.0 - 10.5 K/uL   RBC 4.81 4.22 - 5.81 MIL/uL   Hemoglobin 13.7 13.0 - 17.0 g/dL   HCT 41.3 39.0 - 52.0 %   MCV 85.9 78.0 - 100.0 fL   MCH 28.5 26.0 - 34.0 pg   MCHC 33.2 30.0 - 36.0 g/dL   RDW 13.8 11.5 - 15.5 %   Platelets 241 150 - 400 K/uL   Neutrophils Relative % 78 %    Neutro Abs 10.2 (H) 1.7 - 7.7 K/uL   Lymphocytes Relative 10 %   Lymphs Abs 1.4 0.7 - 4.0 K/uL   Monocytes Relative 11 %   Monocytes Absolute 1.5 (H) 0.1 - 1.0 K/uL   Eosinophils Relative 1 %   Eosinophils Absolute 0.2 0.0 - 0.7 K/uL   Basophils Relative 0 %   Basophils Absolute 0.0 0.0 - 0.1 K/uL  Comprehensive metabolic panel     Status: Abnormal   Collection Time: 01/27/15  6:10 AM  Result Value Ref Range   Sodium 135 135 - 145 mmol/L   Potassium 4.3 3.5 - 5.1 mmol/L   Chloride 99 (L) 101 - 111 mmol/L   CO2 26 22 - 32 mmol/L   Glucose, Bld 119 (H) 65 - 99 mg/dL   BUN 8 6 - 20 mg/dL   Creatinine, Ser 1.00 0.61 - 1.24 mg/dL   Calcium 8.2 (L) 8.9 - 10.3 mg/dL   Total Protein 5.7 (L) 6.5 - 8.1 g/dL   Albumin 3.1 (L) 3.5 - 5.0 g/dL   AST 16 15 - 41 U/L   ALT 12 (L) 17 - 63 U/L   Alkaline Phosphatase 58 38 - 126 U/L   Total Bilirubin 0.7 0.3 - 1.2 mg/dL   GFR calc non Af Amer >60 >60 mL/min   GFR calc Af Amer >60 >60 mL/min    Comment: (NOTE) The eGFR has been calculated using the CKD EPI equation. This calculation has not been validated in all clinical situations. eGFR's persistently <60 mL/min signify possible Chronic Kidney Disease.    Anion gap 10 5 - 15  TSH     Status: Abnormal   Collection Time: 01/27/15  6:10 AM  Result Value Ref Range   TSH 0.182 (L) 0.350 - 4.500 uIU/mL  CBC     Status: Abnormal   Collection Time: 01/28/15  5:05 AM  Result Value Ref Range   WBC 11.8 (H) 4.0 - 10.5 K/uL   RBC 4.76 4.22 - 5.81 MIL/uL   Hemoglobin 13.6 13.0 - 17.0 g/dL   HCT 40.9 39.0 - 52.0 %   MCV 85.9 78.0 - 100.0 fL   MCH 28.6 26.0 - 34.0 pg   MCHC 33.3 30.0 - 36.0 g/dL   RDW 14.0 11.5 - 15.5 %   Platelets 244 150 - 400 K/uL  Basic metabolic panel     Status: Abnormal   Collection Time: 01/28/15  5:05 AM  Result Value Ref Range   Sodium 134 (L) 135 - 145 mmol/L   Potassium 4.4 3.5 - 5.1 mmol/L   Chloride 98 (L) 101 - 111 mmol/L   CO2 27 22 - 32 mmol/L   Glucose, Bld  93 65 - 99 mg/dL   BUN 9 6 - 20 mg/dL  Creatinine, Ser 0.97 0.61 - 1.24 mg/dL   Calcium 8.4 (L) 8.9 - 10.3 mg/dL   GFR calc non Af Amer >60 >60 mL/min   GFR calc Af Amer >60 >60 mL/min    Comment: (NOTE) The eGFR has been calculated using the CKD EPI equation. This calculation has not been validated in all clinical situations. eGFR's persistently <60 mL/min signify possible Chronic Kidney Disease.    Anion gap 9 5 - 15  Uric acid     Status: Abnormal   Collection Time: 01/28/15  5:05 AM  Result Value Ref Range   Uric Acid, Serum 3.0 (L) 4.4 - 7.6 mg/dL    MICRO:  IMAGING: Dg Elbow 2 Views Right  01/27/2015   CLINICAL DATA:  Right elbow pain and increased swelling s/p irrigation and debridement of right distal humerus abscess. No previous injuries to right elbow.  EXAM: RIGHT ELBOW - 2 VIEW  COMPARISON:  01/25/2015 plain films and ultrasound  FINDINGS: A surgical drain is identified overlying the soft tissues upper arm. Bone is intact. No evidence for osteomyelitis or fracture. Small locules of gas are identified within the soft tissues. Posterior soft tissue defect noted.  IMPRESSION: Soft tissue defect along the posterior upper arm.  Soft tissue gas.   Electronically Signed   By: Nolon Nations M.D.   On: 01/27/2015 13:56    HISTORICAL MICRO/IMAGING  Assessment/Plan:   Mr. Deloatch is a 39yo man with PMHx of substance abuse including heroin, hx of injecting steroids and chronic low back pain on suboxone who was found to have a RUE abscess after injecting steroids for body building  RUE Abscess s/p I&D: on 10/4. Blood cultures and wound cultures with no growth to date. Afebrile and WBC count trending down. Questionable history of IVDU. Unclear if patient only has been injecting steroids or if injecting heroin as well. He reported he only inhales heroin. Will recommend to cover for MRSA though he does not appear to be colonized in nares.  - Recommend to discharge on Doxycycline  145m bid (take on full stomach) and Keflex 5014mQID for additional 14 days. He should have follow up to ensure infection clears. May also need phenergan if he gets upset stomach from doxycycline   Thank you for this interesting consult.   CaAlbin FellingMD, MPH Internal Medicine Resident, PGY-II Pager: [phone]  Date: 01/28/2015  Patient name: TrTHORVALD ORSINOMedical record number: [id number] of birth: 05/1975/04/17 I have seen and evaluated TrHershal Coriand discussed their care with the Residency Team.   Assessment and Plan: I have seen and evaluated the patient as outlined above. I agree with the formulated Assessment and Plan as detailed in the residents' consultation note, with the following changes:   1. Recommend to check for hepatitis C antibody . Can refer to [facility] if hep C positive to see if warrants treatment  Will sign off. If questions over the weekend, please contact Dr. CoEstelle GrumblesMD 10/7/20167:08 PM

## 2015-01-28 NOTE — Progress Notes (Signed)
PROGRESS NOTE  Johnathan Brown JYN:[id number] DOB: July 01, 1975 DOA: 01/25/2015 PCP: No primary care provider on file.  HPI/Recap of past 24 hours:  Reported less pain, right elbow able to move a little more.  Mother in room  Assessment/Plan: Principal Problem:   Abscess of right upper extremity Active Problems:   Chronic low back pain   Substance abuse   Tobacco abuse   Abscess of arm, right   Right arm cellulitis   Abscess of upper extremity  Abscess of right upper extremity -Orthopedics has evaluated and s/p intraoperative I/D on 10/4 -Empiric vancomycin with pharmacy dosing/zosyn - blood cultures no growth, drain culture pending -talked to on call orthopedics Dr. Roda Shutters , recommended infectious disease consult. -patient insisted to have penrose drain removed, per RN, no drainage when dressing changed,  penrose drain removed on 10/6 -wbc slightly trending down, appreciate ID input  Right elbow pain: on exam, ? Fluids right elbow, but right elbow x ray did not show joint effusion. Prn pain meds. Less fluctuant today, range of motion slightly increased.  Active Problems:  Chronic low back pain -Patient reports takes Suboxone, usually not every day and dosage can vary from one half to one tablet when taken -Somewhat evasive regarding prescribing provider stating "I go to a doctor in [place]" when asked where he gets his medication from since he states he does not have a primary care provider -Suboxone not on formulary here and in setting of acute pain (see above) was utilize oxycodone and IV morphine -no complaint about back pain in the hospital, ambulating freely   Substance abuse including heroin -Patient admitted to injecting illegally obtained testosterone as adjunct to muscle building/bodybuilding for recent weight loss, later he admitted he use iv heroin. -urine drug screen + amphetamine, benzo, opiates, thc   Tobacco abuse -Nicotine patch -Cessation counseling  provided   Code Status: full  Family Communication: patient and mother  Disposition Plan: home if wbc continue trend down, culture no growth so far   Consultants:  Orthopedics Dr. Lajoyce Corners  Infectious disease Dr. Drue Second  Procedures: IRRIGATION AND DEBRIDEMENT RIGHT ARM, placement Penrose drain on 10/4 Penrose drain removed on 10/6  Antibiotics:  Vanc/zosyn from admission   Objective: BP 121/54 mmHg  Pulse 77  Temp(Src) 98.3 F (36.8 C) (Oral)  Resp 18  Ht  (1.778 m)  Wt 162 lb 6.4 oz (73.664 kg)  BMI 23.30 kg/m2  SpO2 100%  Intake/Output Summary (Last 24 hours) at 01/28/15 1737 Last data filed at 01/28/15 1327  Gross per 24 hour  Intake    485 ml  Output      0 ml  Net    485 ml   Filed Weights   01/24/15 2242  Weight: 162 lb 6.4 oz (73.664 kg)    Exam:   General:  NAD  Cardiovascular: RRR  Respiratory: CTABL  Abdomen: Soft/ND/NT, positive BS  Musculoskeletal: right upper arm post op changes, dressing intact, no erythema, persistent edema, less fluctuant around right elbow, slightly increase range of motion, good peripheral pulse  Neuro: aaox3  Data Reviewed: Basic Metabolic Panel:  Recent Labs Lab 01/24/15 2255 01/25/15 1147 01/26/15 0556 01/27/15 0610 01/28/15 0505  NA 135 139 137 135 134*  K 4.0 4.2 4.0 4.3 4.4  CL 99* 101 102 99* 98*  CO2 GLUCOSE 108* 90 116* 119* 93  BUN CREATININE 0.98 0.82 0.96 1.00 0.97  CALCIUM 8.5*  9.2 8.5* 8.2* 8.4*   Liver Function Tests:  Recent Labs Lab 01/25/15 1147 01/27/15 0610  AST 22 16  ALT 13* 12*  ALKPHOS 77 58  BILITOT 0.9 0.7  PROT 6.7 5.7*  ALBUMIN 3.7 3.1*   No results for input(s): LIPASE, AMYLASE in the last 168 hours. No results for input(s): AMMONIA in the last 168 hours. CBC:  Recent Labs Lab 01/24/15 2255 01/25/15 1147 01/26/15 0556 01/27/15 0610 01/28/15 0505  WBC 10.4 9.1 13.4* 13.2* 11.8*  NEUTROABS  --  6.1  --  10.2*  --   HGB  14.8 16.3 15.2 13.7 13.6  HCT 43.2 48.4 45.1 41.3 40.9  MCV 84.5 84.8 85.4 85.9 85.9  PLT 232 264 256 241 244   Cardiac Enzymes:   No results for input(s): CKTOTAL, CKMB, CKMBINDEX, TROPONINI in the last 168 hours. BNP (last 3 results) No results for input(s): BNP in the last 8760 hours.  ProBNP (last 3 results) No results for input(s): PROBNP in the last 8760 hours.  CBG: No results for input(s): GLUCAP in the last 168 hours.  Recent Results (from the past 240 hour(s))  Culture, blood (routine x 2)     Status: None (Preliminary result)   Collection Time: 01/25/15  9:38 AM  Result Value Ref Range Status   Specimen Description BLOOD RIGHT ANTECUBITAL  Final   Special Requests BOTTLES DRAWN AEROBIC AND ANAEROBIC 5CC  Final   Culture NO GROWTH 3 DAYS  Final   Report Status PENDING  Incomplete  Culture, blood (routine x 2)     Status: None (Preliminary result)   Collection Time: 01/25/15  9:44 AM  Result Value Ref Range Status   Specimen Description BLOOD LEFT ANTECUBITAL  Final   Special Requests BOTTLES DRAWN AEROBIC AND ANAEROBIC 10CC  Final   Culture NO GROWTH 3 DAYS  Final   Report Status PENDING  Incomplete  Surgical pcr screen     Status: Abnormal   Collection Time: 01/25/15 11:47 AM  Result Value Ref Range Status   MRSA, PCR NEGATIVE NEGATIVE Final   Staphylococcus aureus POSITIVE (A) NEGATIVE Final    Comment:        The Xpert SA Assay (FDA approved for NASAL specimens in patients over 37 years of age), is one component of a comprehensive surveillance program.  Test performance has been validated by [facility] for patients greater than or equal to 44 year old. It is not intended to diagnose infection nor to guide or monitor treatment.   Anaerobic culture     Status: None (Preliminary result)   Collection Time: 01/25/15  9:24 PM  Result Value Ref Range Status   Specimen Description ABSCESS RIGHT ARM  Final   Special Requests NONE  Final   Gram Stain   Final     FEW WBC PRESENT, PREDOMINANTLY PMN NO SQUAMOUS EPITHELIAL CELLS SEEN NO ORGANISMS SEEN Performed at [facility]    Culture   Final    NO ANAEROBES ISOLATED; CULTURE IN PROGRESS FOR 5 DAYS Performed at [facility]    Report Status PENDING  Incomplete  Culture, routine-abscess     Status: None (Preliminary result)   Collection Time: 01/25/15  9:24 PM  Result Value Ref Range Status   Specimen Description ABSCESS RIGHT ARM  Final   Special Requests NONE  Final   Gram Stain   Final    FEW WBC PRESENT, PREDOMINANTLY PMN NO SQUAMOUS EPITHELIAL CELLS SEEN NO ORGANISMS SEEN Performed at  [facility]   Final    NO GROWTH 2 DAYS Performed at [facility]    Report Status PENDING  Incomplete     Studies: No results found.  Scheduled Meds: . antiseptic oral rinse  7 mL Mouth Rinse BID  . Chlorhexidine Gluconate Cloth  6 each Topical Daily  . enoxaparin (LOVENOX) injection  40 mg Subcutaneous Q24H  . lactobacillus  1 g Oral TID WC  . mupirocin ointment  1 application Nasal BID  . nicotine  21 mg Transdermal Daily  . piperacillin-tazobactam (ZOSYN)  IV  3.375 g Intravenous Q8H  . senna-docusate  2 tablet Oral BID  . vancomycin  1,000 mg Intravenous Q8H    Continuous Infusions: . sodium chloride 10 mL/hr at 01/25/15 2301  . dextrose 5 % and 0.9 % NaCl with KCl 20 mEq/L Stopped (01/25/15 2300)     Time spent: , more than 50%  incoordination of care, talking to patient and his mother regarding plan of care.  Kaleigha Chamberlin MD, PhD  [facility] Pager [phone]. If 7PM-7AM, please contact night-coverage at [email or web address], password [facility] 01/28/2015, 5:37 PM  LOS: 3 days

## 2015-01-28 NOTE — Progress Notes (Signed)
ANTIBIOTIC CONSULT NOTE - FOLLOW UP  Pharmacy Consult for vancomycin and Zosyn Indication: RUE abscess  No Known Allergies  Patient Measurements: Height:  (177.8 cm) Weight: 162 lb 6.4 oz (73.664 kg) IBW/kg (Calculated) : 73 Adjusted Body Weight:   Vital Signs: Temp: 98.3 F (36.8 C) (10/07 0534) Temp Source: Oral (10/07 0534) BP: 116/59 mmHg (10/07 0534) Pulse Rate: 79 (10/07 0534) Intake/Output from previous day: 10/06 0701 - 10/07 0700 In: 490 [P.O.:240; IV Piggyback:250] Out: -   Labs:  Recent Labs  01/26/15 0556 01/27/15 0610 01/28/15 0505  WBC 13.4* 13.2* 11.8*  HGB 15.2 13.7 13.6  PLT 256 241 244  CREATININE 0.96 1.00 0.97  Estimated CrCl: > 100 ml/min   Micro:  10/4 Blood x 2 - ngtd  10/4 abscess - ngtd  MRSA PCR +   Antibiotics: Vancomycin 10/4>>  Zosyn 10/4>>   Vancomycin levels: 10/5 VT 14 - cont 1000 mg q8h     Assessment: 39 y/o male who injected no-Rx testosterone 4 days prior then noticed pain and swelling at the injection site. He was admitted and started on vancomycin and Zosyn. He went to OR for I&D on 10/4.   Goal of Therapy:  Vancomycin trough level 15-20 mcg/ml  Plan:  - Continue vancomycin 1000 mg IV q8h - If continued will obtain another trough next week to monitor for accumulation - Continue Zosyn 3.375 g IV q8h to be infused over 4 hours - Monitor renal function, clinical progress, and culture data - Following daily, notes prn  Pollyann Samples, PharmD, BCPS 01/28/2015, 9:07 AM Pager: [phone]

## 2015-01-29 LAB — CBC WITH DIFFERENTIAL/PLATELET
BASOS ABS: 0 10*3/uL (ref 0.0–0.1)
BASOS PCT: 0 %
EOS PCT: 3 %
Eosinophils Absolute: 0.4 10*3/uL (ref 0.0–0.7)
HEMATOCRIT: 39.1 % (ref 39.0–52.0)
Hemoglobin: 13 g/dL (ref 13.0–17.0)
Lymphocytes Relative: 11 %
Lymphs Abs: 1.2 10*3/uL (ref 0.7–4.0)
MCH: 28.6 pg (ref 26.0–34.0)
MCHC: 33.2 g/dL (ref 30.0–36.0)
MCV: 86.1 fL (ref 78.0–100.0)
MONO ABS: 1.3 10*3/uL — AB (ref 0.1–1.0)
Monocytes Relative: 11 %
NEUTROS ABS: 8.4 10*3/uL — AB (ref 1.7–7.7)
Neutrophils Relative %: 75 %
PLATELETS: 218 10*3/uL (ref 150–400)
RBC: 4.54 MIL/uL (ref 4.22–5.81)
RDW: 14.1 % (ref 11.5–15.5)
WBC: 11.3 10*3/uL — AB (ref 4.0–10.5)

## 2015-01-29 LAB — BASIC METABOLIC PANEL
ANION GAP: 11 (ref 5–15)
BUN: 8 mg/dL (ref 6–20)
CALCIUM: 8.7 mg/dL — AB (ref 8.9–10.3)
CO2: 27 mmol/L (ref 22–32)
Chloride: 99 mmol/L — ABNORMAL LOW (ref 101–111)
Creatinine, Ser: 1 mg/dL (ref 0.61–1.24)
Glucose, Bld: 105 mg/dL — ABNORMAL HIGH (ref 65–99)
Potassium: 4.5 mmol/L (ref 3.5–5.1)
Sodium: 137 mmol/L (ref 135–145)

## 2015-01-29 LAB — CULTURE, ROUTINE-ABSCESS: CULTURE: NO GROWTH

## 2015-01-29 MED ORDER — DOXYCYCLINE HYCLATE 100 MG PO CAPS: 100.0000 mg | ORAL_CAPSULE | Freq: Two times a day (BID) | ORAL | Status: DC

## 2015-01-29 MED ORDER — CEPHALEXIN 500 MG PO CAPS: 500.0000 mg | ORAL_CAPSULE | Freq: Two times a day (BID) | ORAL | Status: DC

## 2015-01-29 NOTE — Progress Notes (Signed)
01/29/15 Patient left floor to go home without Physician orders, assessment not complete and meds not given.

## 2015-01-29 NOTE — Progress Notes (Signed)
CM went to room to speak to patient about home and discharge needs including medications and pt was not in the room. CM spoke to staff member who said the member left AMA without signing any paperwork or notifying the nurse.
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
## 2015-01-30 LAB — HEPATITIS C ANTIBODY

## 2015-01-30 LAB — CULTURE, BLOOD (ROUTINE X 2)
Culture: NO GROWTH
Culture: NO GROWTH

## 2015-01-31 LAB — ANAEROBIC CULTURE

## 2015-02-01 ENCOUNTER — Ambulatory Visit: Payer: Self-pay | Admitting: Family Medicine

## 2015-11-05 IMAGING — DX DG ELBOW 2V*R*
2 series · 2 of 2 positions shown · non-contrast
Comparison: 01/25/2015 plain films and ultrasound

CLINICAL DATA: Right elbow pain and increased swelling s/p
irrigation and debridement of right distal humerus abscess. No
previous injuries to right elbow.

EXAM:
RIGHT ELBOW - 2 VIEW

[elbow ap]
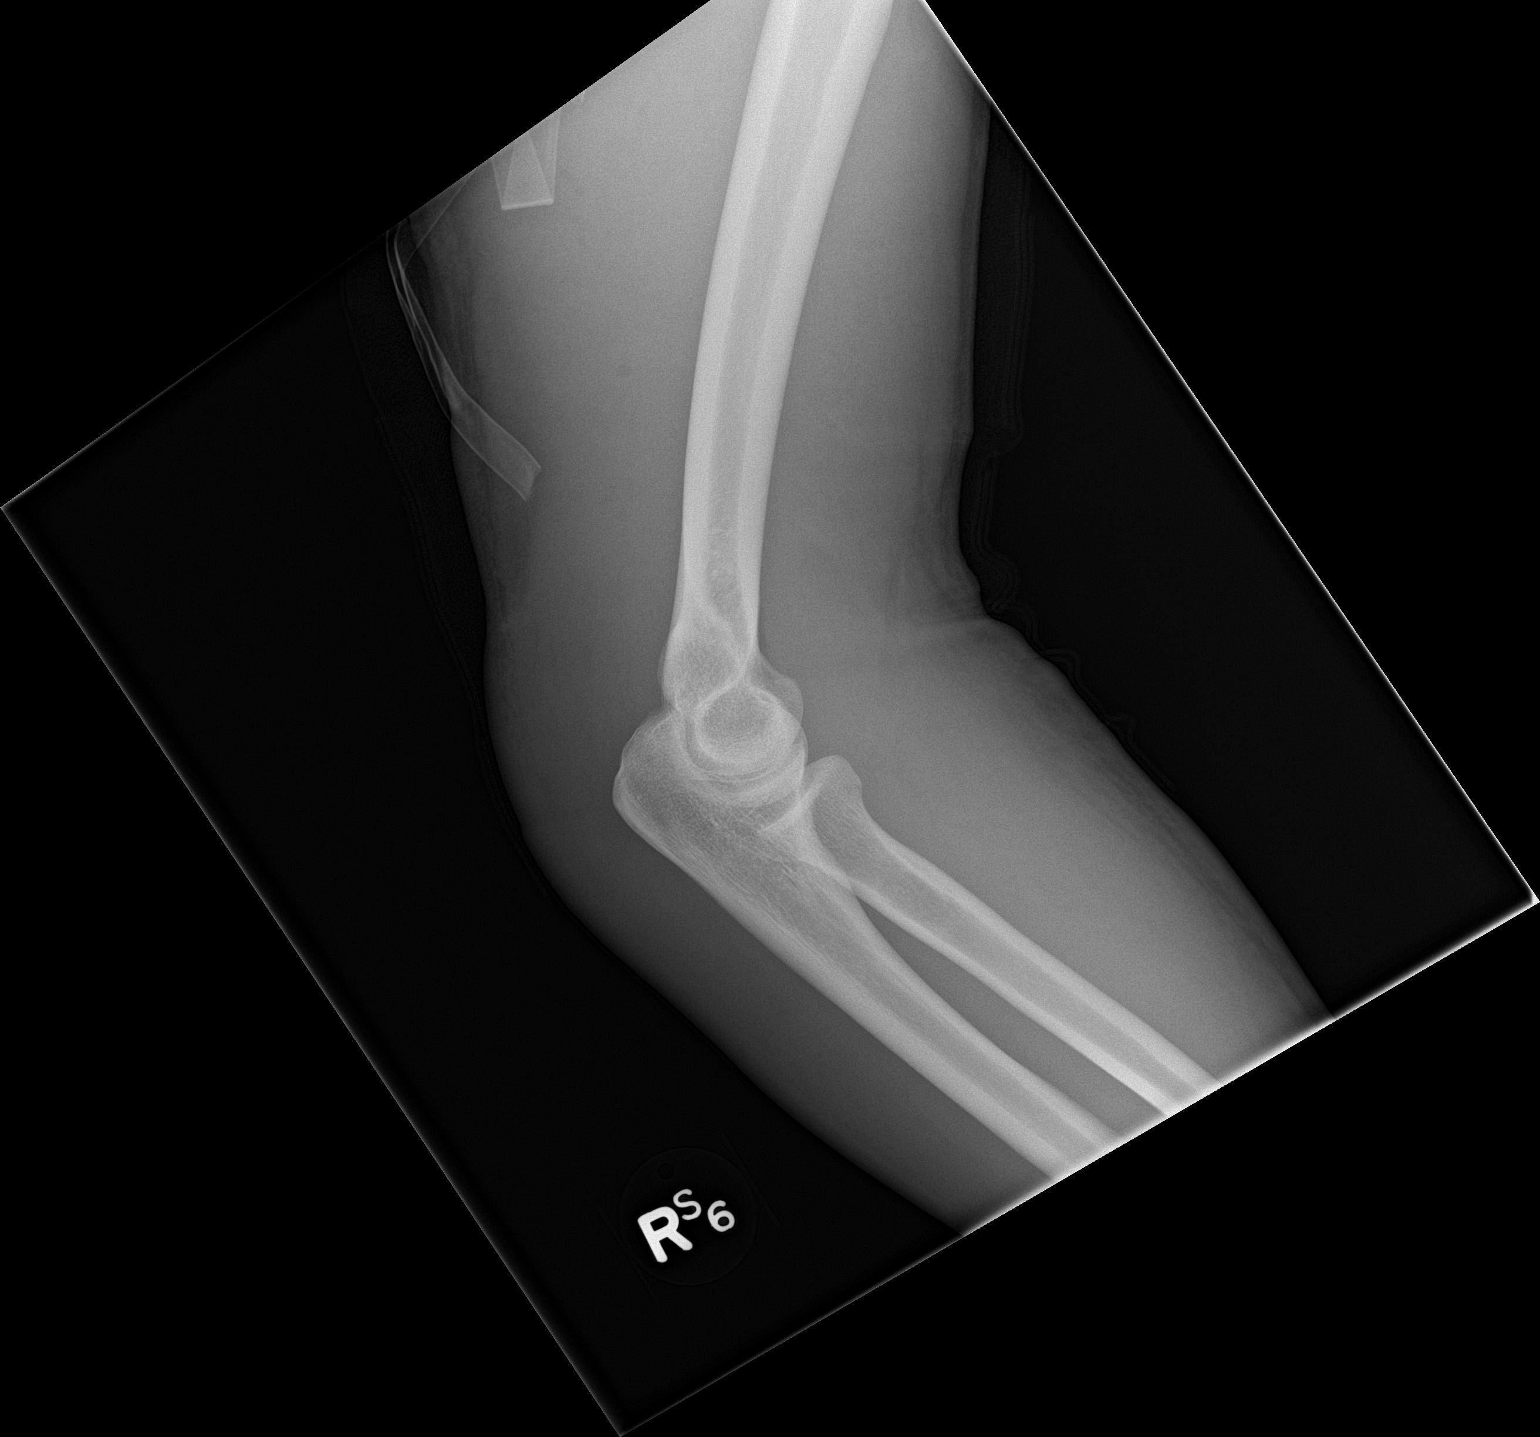

[elbow lat]
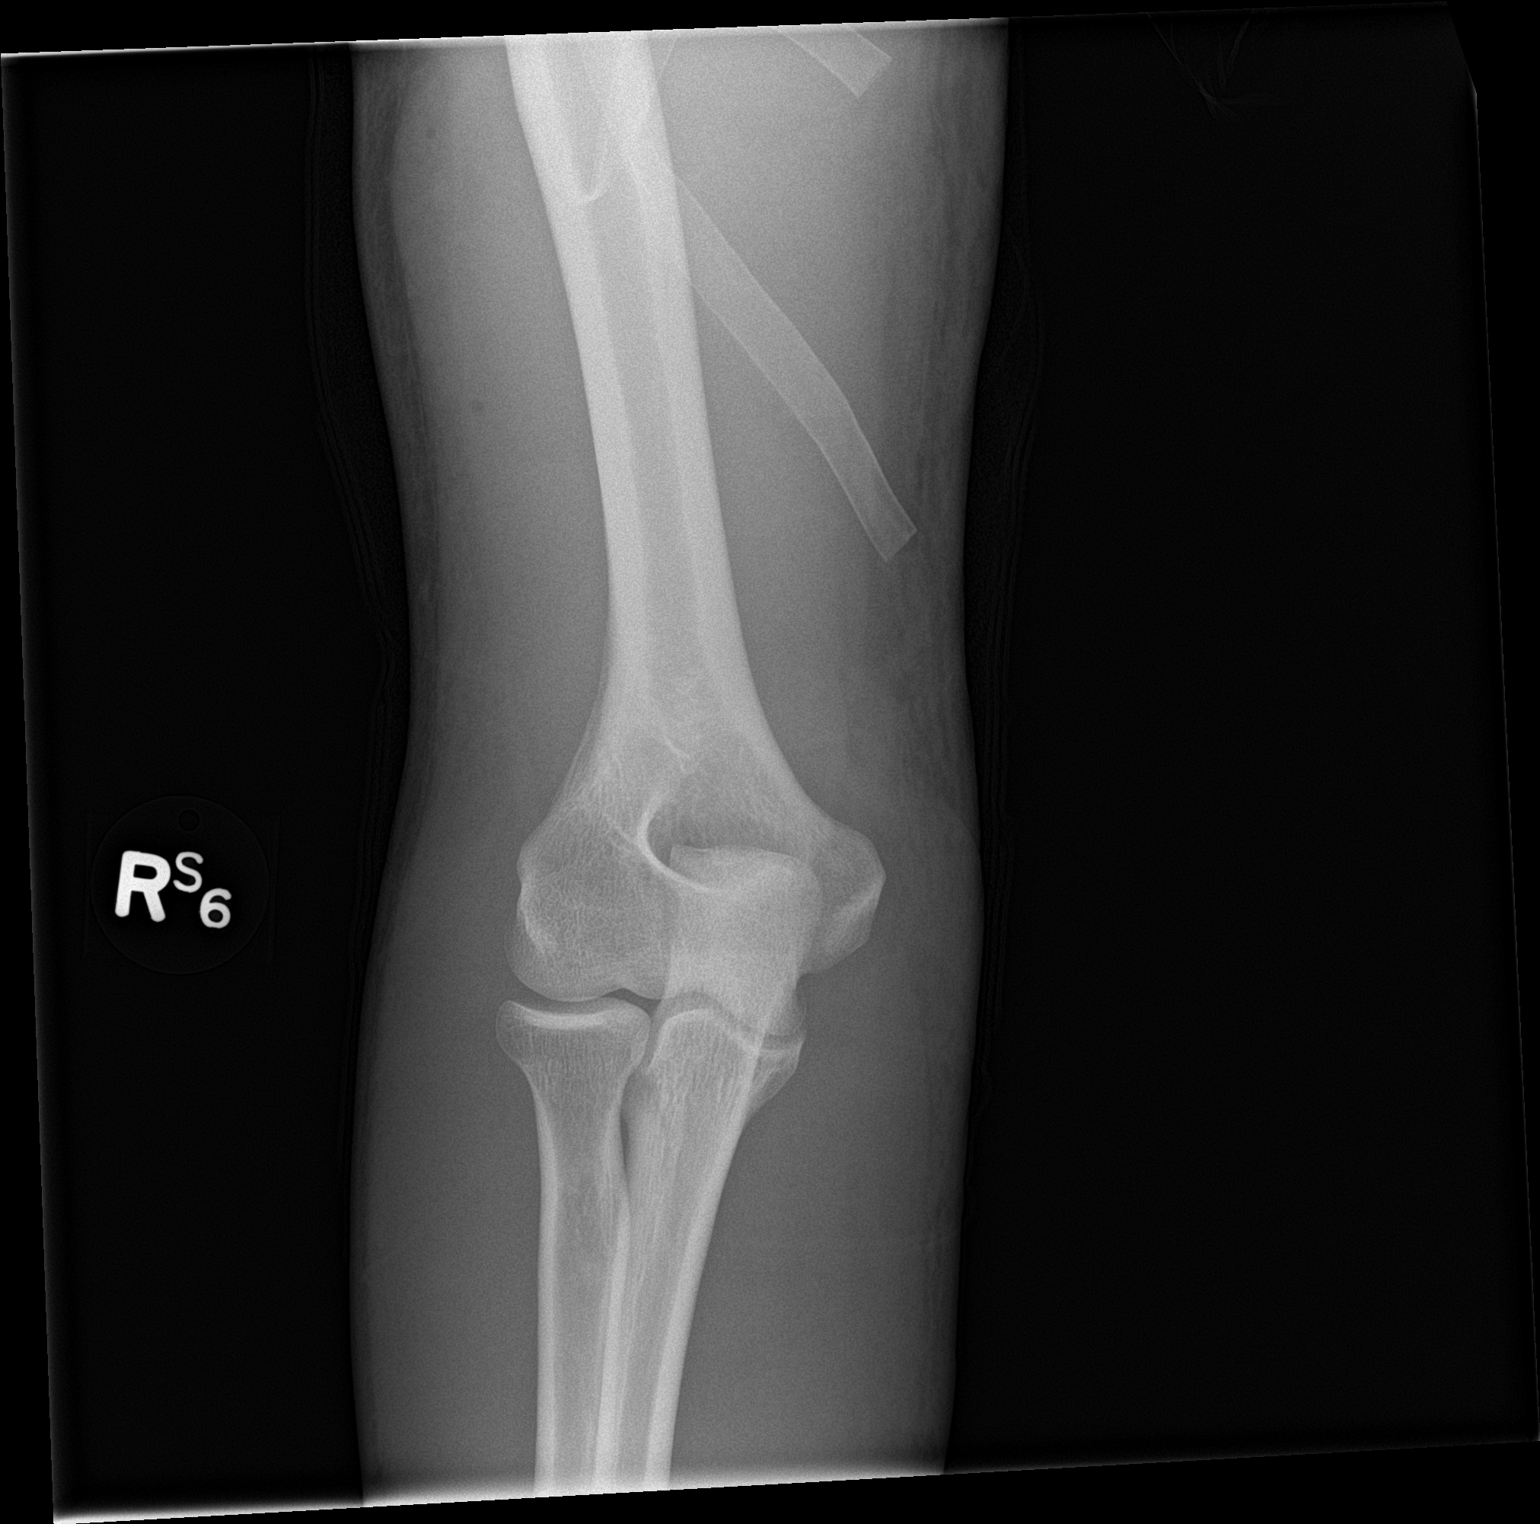

[2 of 2 positions shown; findings below may reference images not displayed]

FINDINGS: A surgical drain is identified overlying the soft tissues upper arm.
Bone is intact. No evidence for osteomyelitis or fracture. Small
locules of gas are identified within the soft tissues. Posterior
soft tissue defect noted.
IMPRESSION: Soft tissue defect along the posterior upper arm.  Soft tissue gas.

## 2017-03-05 ENCOUNTER — Ambulatory Visit (INDEPENDENT_AMBULATORY_CARE_PROVIDER_SITE_OTHER): Payer: 59 | Admitting: Orthopaedic Surgery

## 2017-03-05 ENCOUNTER — Encounter (INDEPENDENT_AMBULATORY_CARE_PROVIDER_SITE_OTHER): Payer: Self-pay | Admitting: Orthopaedic Surgery

## 2017-03-05 DIAGNOSIS — M79642 Pain in left hand: Secondary | ICD-10-CM

## 2017-03-05 MED ORDER — AMOXICILLIN-POT CLAVULANATE 875-125 MG PO TABS: 1.0000 | ORAL_TABLET | Freq: Two times a day (BID) | ORAL | 0 refills | Status: DC

## 2017-03-05 MED ORDER — NAPROXEN 500 MG PO TABS: 500.0000 mg | ORAL_TABLET | Freq: Two times a day (BID) | ORAL | 3 refills | Status: DC

## 2017-03-05 NOTE — Progress Notes (Addendum)
Office Visit Note   Patient: Johnathan Brown           Date of Birth: 09/18/1975           MRN: [id number] Visit Date: 03/05/2017              Requested by: No referring provider defined for this encounter. PCP: Patient, No Pcp Per   Assessment & Plan: Visit Diagnoses:  1. Pain of left hand     Plan: Impression is left hand swelling.  I did discuss that he may have a partially treated infection although I think the index of suspicion is low however I will be thorough and just put him on some days of Augmentin just to be sure.  I think it is more of an inflammatory process or a postinfectious skin changes.  Prescription for naproxen.  Other differential diagnoses that I considered were arthritis versus gout.  We did discuss the warning signs of infection and he understands that he can return to my office at any time if there is any worsening or any concerns or questions. Total face to face encounter time was greater than 45 minutes and over half of this time was spent in counseling and/or coordination of care.  Follow-Up Instructions: Return if symptoms worsen or fail to improve.   Orders:  No orders of the defined types were placed in this encounter.  Meds ordered this encounter  Medications  . amoxicillin-clavulanate (AUGMENTIN) 875-125 MG tablet    Sig: Take 1 tablet 2 (two) times daily by mouth.    Dispense:  20 tablet    Refill:  0  . naproxen (NAPROSYN) 500 MG tablet    Sig: Take 1 tablet (500 mg total) 2 (two) times daily with a meal by mouth.    Dispense:  30 tablet    Refill:  3      Procedures: No procedures performed   Clinical Data: No additional findings.   Subjective: Chief Complaint  Patient presents with  . Left Hand - Pain    Patient is a 41 year old gentleman who comes in with left hand swelling on the dorsal aspect of the third MCP joint.  He states that he is hand in that area about a month ago and finished a course of 10 days of antibiotics  that was interrupted briefly by incarceration.  He denies any pain currently he denies any constitutional symptoms.  He is very anxious and he had a previous arm abscess from drug use that needed formal debridement.  He denies any cellulitis.  He does endorse some discomfort and swelling and stiffness secondary to the swelling.  He is concerned that he has a persistent infection.    Review of Systems  Constitutional: Negative.   All other systems reviewed and are negative.    Objective: Vital Signs: There were no vitals taken for this visit.  Physical Exam  Constitutional: He is oriented to person, place, and time. He appears well-developed and well-nourished.  HENT:  Head: Normocephalic and atraumatic.  Eyes: Pupils are equal, round, and reactive to light.  Neck: Neck supple.  Pulmonary/Chest: Effort normal.  Abdominal: Soft.  Musculoskeletal: Normal range of motion.  Neurological: He is alert and oriented to person, place, and time.  Skin: Skin is warm.  Psychiatric: He has a normal mood and affect. His behavior is normal. Judgment and thought content normal.  Nursing note and vitals reviewed.   Ortho Exam Left hand exam shows slightly dark reddish  discoloration over the third MCP joint is consistent with skin reaction to the cold versus postinfectious skin color changes.  There is no fluctuance or cellulitis or induration.  He has no evidence of septic arthritis.  This is not significantly tender.  He has full extension and flexion of the finger. Specialty Comments:  No specialty comments available.  Imaging: No results found.   PMFS History: Patient Active Problem List   Diagnosis Date Noted  . Abscess of right upper extremity 01/25/2015  . Chronic low back pain 01/25/2015  . Substance abuse (HCC) 01/25/2015  . Tobacco abuse 01/25/2015  . Abscess of upper extremity 01/25/2015  . Abscess of arm, right   . Right arm cellulitis    Past Medical History:  Diagnosis  Date  . Abscess 01/2015  . GERD (gastroesophageal reflux disease)     History reviewed. No pertinent family history.  Past Surgical History:  Procedure Laterality Date  . INNER EAR SURGERY     x 5    Social History   Occupational History  . Not on file  Tobacco Use  . Smoking status: Current Every Day Smoker    Packs/day: 0.50    Years: 15.00    Pack years: 7.50    Types: Cigarettes  . Smokeless tobacco: Never Used  Substance and Sexual Activity  . Alcohol use: No  . Drug use: No  . Sexual activity: Not on file

## 2018-05-29 ENCOUNTER — Other Ambulatory Visit: Payer: Self-pay

## 2018-05-29 ENCOUNTER — Encounter (HOSPITAL_COMMUNITY): Payer: Self-pay

## 2018-05-29 ENCOUNTER — Emergency Department (HOSPITAL_COMMUNITY)
Admission: EM | Admit: 2018-05-29 | Discharge: 2018-05-30 | Disposition: A | Discharge status: Home / Self Care | Payer: 59 | Attending: Emergency Medicine | Admitting: Emergency Medicine

## 2018-05-29 DIAGNOSIS — F17210 Nicotine dependence, cigarettes, uncomplicated: Secondary | ICD-10-CM | POA: Insufficient documentation

## 2018-05-29 DIAGNOSIS — H5712 Ocular pain, left eye: Secondary | ICD-10-CM | POA: Insufficient documentation

## 2018-05-29 MED ORDER — TETRACAINE HCL 0.5 % OP SOLN
2.0000 [drp] | Freq: Once | OPHTHALMIC | Status: AC
  Administered 2018-05-30: 2 [drp] via OPHTHALMIC
  Filled 2018-05-29: qty 4

## 2018-05-29 MED ORDER — FLUORESCEIN SODIUM 1 MG OP STRP
1.0000 | ORAL_STRIP | Freq: Once | OPHTHALMIC | Status: AC
  Administered 2018-05-30: 1 via OPHTHALMIC
  Filled 2018-05-29: qty 1

## 2018-05-29 NOTE — ED Triage Notes (Signed)
Pt reports L eye injury. He was pressure washing some brick and thinks that he got something in his eye around 5p/ He went to sleep and woke up around 11p in pain.

## 2018-05-29 NOTE — ED Notes (Signed)
Bed: WA08 Expected date:  Expected time:  Means of arrival:  Comments: 

## 2018-05-30 MED ORDER — ATROPINE SULFATE 1 % OP SOLN
1.0000 [drp] | Freq: Once | OPHTHALMIC | Status: AC
  Administered 2018-05-30: 1 [drp] via OPHTHALMIC
  Filled 2018-05-30: qty 2

## 2018-05-30 MED ORDER — ERYTHROMYCIN 5 MG/GM OP OINT
TOPICAL_OINTMENT | Freq: Once | OPHTHALMIC | Status: AC
  Administered 2018-05-30: 02:00:00 via OPHTHALMIC

## 2018-05-30 MED ORDER — ERYTHROMYCIN 5 MG/GM OP OINT
TOPICAL_OINTMENT | OPHTHALMIC | Status: AC
  Filled 2018-05-30: qty 3.5

## 2018-05-30 NOTE — ED Provider Notes (Signed)
[facility]-EMERGENCY DEPT Provider Note   CSN: [id number] Arrival date & time: 05/29/18  2301     History   Chief Complaint Chief Complaint  Patient presents with  . Eye Injury    HPI Johnathan Brown is a 43 y.o. male.  Patient to ED with complaint of left eye pain. He has been pressure washing brick walls for 2 days. He states he has felt he has gotten material in his eyes that improved with irrigation on day one, and did not resolve with irrigation on day two. He woke tonight (day 2) with severe left eye pain and a feeling of foreign body under the upper lid. No visual changes. He has photosensitivity. No visual change. He wears contacts but the left contact has been taken out.   The history is provided by the patient and the spouse. No language interpreter was used.    Past Medical History:  Diagnosis Date  . Abscess 01/2015  . GERD (gastroesophageal reflux disease)     Patient Active Problem List   Diagnosis Date Noted  . Abscess of right upper extremity 01/25/2015  . Chronic low back pain 01/25/2015  . Substance abuse (HCC) 01/25/2015  . Tobacco abuse 01/25/2015  . Abscess of upper extremity 01/25/2015  . Abscess of arm, right   . Right arm cellulitis     Past Surgical History:  Procedure Laterality Date  . I&D EXTREMITY Right 01/25/2015   Procedure: MINOR IRRIGATION AND DEBRIDEMENT RIGHT ARM;  Surgeon: Nadara Mustard, MD;  Location: [facility] OR;  Service: Orthopedics;  Laterality: Right;  . INNER EAR SURGERY     x 5         Home Medications    Prior to Admission medications   Medication Sig Start Date End Date Taking? Authorizing Provider  amoxicillin-clavulanate (AUGMENTIN) 875-125 MG tablet Take 1 tablet 2 (two) times daily by mouth. 03/05/17   Tarry Kos, MD  cephALEXin (KEFLEX) 500 MG capsule Take 1 capsule (500 mg total) by mouth 2 (two) times daily. Patient not taking: Reported on 03/05/2017 01/29/15   Albertine Grates, MD  doxycycline  (VIBRAMYCIN) 100 MG capsule Take 1 capsule (100 mg total) by mouth 2 (two) times daily. Patient not taking: Reported on 03/05/2017 01/29/15   Albertine Grates, MD  naproxen (NAPROSYN) 500 MG tablet Take 1 tablet (500 mg total) 2 (two) times daily with a meal by mouth. 03/05/17   Tarry Kos, MD  SUBOXONE 8-2 MG FILM Place 0.5-1.5 Film under the tongue 2 (two) times daily. 1.5 films in the morning and 0.5 in the evening 12/25/14   [provider]    Family History History reviewed. No pertinent family history.  Social History Social History   Tobacco Use  . Smoking status: Current Every Day Smoker    Packs/day: 0.50    Years: 15.00    Pack years: 7.50    Types: Cigarettes  . Smokeless tobacco: Never Used  Substance Use Topics  . Alcohol use: No  . Drug use: No     Allergies   Patient has no known allergies.   Review of Systems Review of Systems  Eyes: Positive for photophobia, pain and redness. Negative for visual disturbance.  Gastrointestinal: Negative for nausea.  Neurological: Negative for headaches.     Physical Exam Updated Vital Signs BP 109/82 (BP Location: Left Arm)   Pulse 64   Temp 98.8 F (37.1 C) (Oral)   Resp 17   Ht 5'  10" (1.778 m)   Wt 76.2 kg   SpO2 99%   BMI 24.11 kg/m   Physical Exam Vitals signs and nursing note reviewed.  Constitutional:      Appearance: He is well-developed.  Eyes:     General: Lids are normal. Lids are everted, no foreign bodies appreciated. Vision grossly intact. Gaze aligned appropriately.        Left eye: No foreign body, discharge or hordeolum.     Extraocular Movements:     Right eye: Normal extraocular motion.     Left eye: Normal extraocular motion.     Conjunctiva/sclera:     Left eye: Left conjunctiva is injected. No chemosis, exudate or hemorrhage.    Pupils:     Right eye: No corneal abrasion or fluorescein uptake.     Left eye: No corneal abrasion or fluorescein uptake.     Comments: Pupils are  constricted. Left photophobia. No hyphema  Neck:     Musculoskeletal: Normal range of motion.  Pulmonary:     Effort: Pulmonary effort is normal.  Musculoskeletal: Normal range of motion.  Skin:    General: Skin is warm and dry.  Neurological:     Mental Status: He is alert and oriented to person, place, and time.      ED Treatments / Results  Labs (all labs ordered are listed, but only abnormal results are displayed) Labs Reviewed - No data to display  EKG None  Radiology No results found.  Procedures Procedures (including critical care time)  Medications Ordered in ED Medications  tetracaine (PONTOCAINE) 0.5 % ophthalmic solution 2 drop (2 drops Left Eye Given 05/30/18 0014)  fluorescein ophthalmic strip 1 strip (1 strip Left Eye Given 05/30/18 0015)  atropine 1 % ophthalmic solution 1 drop (1 drop Left Eye Given by Other 05/30/18 0146)  erythromycin ophthalmic ointment ( Left Eye Given 05/30/18 0147)     Initial Impression / Assessment and Plan / ED Course  I have reviewed the triage vital signs and the nursing notes.  Pertinent labs & imaging results that were available during my care of the patient were reviewed by me and considered in my medical decision making (see chart for details).     Patient to ED with severe left eye pain felt to be FB after pressure washing for 2 days.   No FB observed on exam. No corneal abrasion on fluorescein staining. No globe tenderness. Left pupil is constricted and given photosensitivity, consider iritis. Atropine applied to left eye. He states there is some relief of pain.  Patient insists that he feels a FB in the upper lid. Irrigation ordered. He feels some better afterward but also reports he feels the FB just moved locations.   Will cover with erythromycin ointment and recommend ophthalmology follow up for recheck of eye pain.    Final Clinical Impressions(s) / ED Diagnoses   Final diagnoses:  None   1. Left eye pain  ED  Discharge Orders    None       Elpidio Anis, PA-C 05/30/18 9485    Sabas Sous, MD 05/30/18 [phone]

## 2018-05-30 NOTE — Discharge Instructions (Addendum)
Use the antibiotic ointment 3 times a day for the next 5 days to cover for any topical eye injury. Avoid using your contact until pain is resolved. Call Dr. Genia Del to schedule an appointment for recheck in 1-2 days. Return here as needed for new or worsening symptoms.

## 2018-09-08 ENCOUNTER — Emergency Department
Admission: EM | Admit: 2018-09-08 | Discharge: 2018-09-08 | Discharge status: Against Medical Advice | Payer: Self-pay | Attending: Emergency Medicine | Admitting: Emergency Medicine

## 2018-09-08 ENCOUNTER — Encounter: Payer: Self-pay | Admitting: Emergency Medicine

## 2018-09-08 ENCOUNTER — Other Ambulatory Visit: Payer: Self-pay

## 2018-09-08 DIAGNOSIS — H5789 Other specified disorders of eye and adnexa: Secondary | ICD-10-CM | POA: Insufficient documentation

## 2018-09-08 DIAGNOSIS — F17210 Nicotine dependence, cigarettes, uncomplicated: Secondary | ICD-10-CM | POA: Insufficient documentation

## 2018-09-08 LAB — CBC WITH DIFFERENTIAL/PLATELET
Abs Immature Granulocytes: 0.03 10*3/uL (ref 0.00–0.07)
Basophils Absolute: 0 10*3/uL (ref 0.0–0.1)
Basophils Relative: 0 %
Eosinophils Absolute: 0.2 10*3/uL (ref 0.0–0.5)
Eosinophils Relative: 2 %
HCT: 44.2 % (ref 39.0–52.0)
Hemoglobin: 14.8 g/dL (ref 13.0–17.0)
Immature Granulocytes: 0 %
Lymphocytes Relative: 19 %
Lymphs Abs: 1.9 10*3/uL (ref 0.7–4.0)
MCH: 29.2 pg (ref 26.0–34.0)
MCHC: 33.5 g/dL (ref 30.0–36.0)
MCV: 87.4 fL (ref 80.0–100.0)
Monocytes Absolute: 0.8 10*3/uL (ref 0.1–1.0)
Monocytes Relative: 8 %
Neutro Abs: 7.2 10*3/uL (ref 1.7–7.7)
Neutrophils Relative %: 71 %
Platelets: 240 10*3/uL (ref 150–400)
RBC: 5.06 MIL/uL (ref 4.22–5.81)
RDW: 13 % (ref 11.5–15.5)
WBC: 10.1 10*3/uL (ref 4.0–10.5)
nRBC: 0 % (ref 0.0–0.2)

## 2018-09-08 LAB — ETHANOL: Alcohol, Ethyl (B): <10 mg/dL (ref <10)

## 2018-09-08 LAB — BASIC METABOLIC PANEL
Anion gap: 6 (ref 5–15)
BUN: 21 mg/dL — ABNORMAL HIGH (ref 6–20)
CO2: 30 mmol/L (ref 22–32)
Calcium: 8.9 mg/dL (ref 8.9–10.3)
Chloride: 105 mmol/L (ref 98–111)
Creatinine, Ser: 0.95 mg/dL (ref 0.61–1.24)
GFR calc Af Amer: >60 mL/min (ref >60)
GFR calc non Af Amer: >60 mL/min (ref >60)
Glucose, Bld: 97 mg/dL (ref 70–99)
Potassium: 3.8 mmol/L (ref 3.5–5.1)
Sodium: 141 mmol/L (ref 135–145)

## 2018-09-08 MED ORDER — TETRACAINE HCL 0.5 % OP SOLN
1.0000 [drp] | Freq: Once | OPHTHALMIC | Status: AC
  Administered 2018-09-08: 1 [drp] via OPHTHALMIC
  Filled 2018-09-08: qty 4

## 2018-09-08 MED ORDER — ERYTHROMYCIN 5 MG/GM OP OINT: 1.0000 "application " | TOPICAL_OINTMENT | Freq: Four times a day (QID) | OPHTHALMIC | 0 refills | Status: DC

## 2018-09-08 NOTE — Discharge Instructions (Addendum)
It is extremely important that you follow up with the eye doctor tomorrow. We do encourage you to return to the emergency department tonight if you change your mind about receiving treatment. Please seek medical attention for any high fevers, chest pain, shortness of breath, change in behavior, persistent vomiting, bloody stool or any other new or concerning symptoms.

## 2018-09-08 NOTE — ED Notes (Signed)
Spoke with patient at length about decision to leave AMA, patient does not want to stay. Accepts risks

## 2018-09-08 NOTE — ED Notes (Signed)
Assisted out of vehicle with girlfriend; pt with slurred speech; falling asleep during questioning; pt denies any ETOH or drug use; st "I'm just sleepy cause this happened 5 hours ago and my eyes are closed"; pt sent over from [facility] for eval due to altered mental status; pt reports bilat eye pain after getting dust in them while working on a stairwell; Press photographer called for room for eval

## 2018-09-08 NOTE — ED Provider Notes (Signed)
[facility] Emergency Department Provider Note   ____________________________________________   I have reviewed the triage vital signs and the nursing notes.   HISTORY  Chief Complaint Eye Problem   History limited by: Poor historian.   HPI Johnathan Brown is a 43 y.o. male who presents to the emergency department today because of concern for eye injury. The patient states that he was sanding sheet rock and got some of the material in his eye. Says that it occurred at roughly 1pm this afternoon. Went to urgent care this evening. Unclear what caused the delay in seeking care or when the pain started. He is complaining of discomfort in bilateral eyes. Says that he cannot open them.   Records reviewed. Per medical record review patient has a history of GERD, visit to an ER a couple of months ago for left eye pain.  Past Medical History:  Diagnosis Date  . Abscess 01/2015  . GERD (gastroesophageal reflux disease)     Patient Active Problem List   Diagnosis Date Noted  . Abscess of right upper extremity 01/25/2015  . Chronic low back pain 01/25/2015  . Substance abuse (HCC) 01/25/2015  . Tobacco abuse 01/25/2015  . Abscess of upper extremity 01/25/2015  . Abscess of arm, right   . Right arm cellulitis     Past Surgical History:  Procedure Laterality Date  . I&D EXTREMITY Right 01/25/2015   Procedure: MINOR IRRIGATION AND DEBRIDEMENT RIGHT ARM;  Surgeon: Nadara Mustard, MD;  Location: [facility] OR;  Service: Orthopedics;  Laterality: Right;  . INNER EAR SURGERY     x 5     Prior to Admission medications   Medication Sig Start Date End Date Taking? Authorizing Provider  amoxicillin-clavulanate (AUGMENTIN) 875-125 MG tablet Take 1 tablet 2 (two) times daily by mouth. 03/05/17   Tarry Kos, MD  cephALEXin (KEFLEX) 500 MG capsule Take 1 capsule (500 mg total) by mouth 2 (two) times daily. Patient not taking: Reported on 03/05/2017 01/29/15   Albertine Grates, MD   doxycycline (VIBRAMYCIN) 100 MG capsule Take 1 capsule (100 mg total) by mouth 2 (two) times daily. Patient not taking: Reported on 03/05/2017 01/29/15   Albertine Grates, MD  erythromycin ophthalmic ointment Place 1 application into both eyes 4 (four) times daily. 09/08/18   Phineas Semen, MD  naproxen (NAPROSYN) 500 MG tablet Take 1 tablet (500 mg total) 2 (two) times daily with a meal by mouth. 03/05/17   Tarry Kos, MD  SUBOXONE 8-2 MG FILM Place 0.5-1.5 Film under the tongue 2 (two) times daily. 1.5 films in the morning and 0.5 in the evening 12/25/14   [provider]    Allergies Patient has no known allergies.  No family history on file.  Social History Social History   Tobacco Use  . Smoking status: Current Every Day Smoker    Packs/day: 0.50    Years: 15.00    Pack years: 7.50    Types: Cigarettes  . Smokeless tobacco: Never Used  Substance Use Topics  . Alcohol use: No  . Drug use: No    Review of Systems Constitutional: No fever/chills Eyes: Positive for bilateral eye pain. ENT: No sore throat. Cardiovascular: Denies chest pain. Respiratory: Denies shortness of breath. Gastrointestinal: No abdominal pain.  No nausea, no vomiting.  No diarrhea.   Genitourinary: Negative for dysuria. Musculoskeletal: Negative for back pain. Skin: Negative for rash. Neurological: Negative for headaches, focal weakness or numbness.  ____________________________________________   PHYSICAL  EXAM:  VITAL SIGNS: ED Triage Vitals  Enc Vitals Group     BP 09/08/18 2033 128/79     Pulse Rate 09/08/18 2033 80     Resp 09/08/18 2033 16     Temp 09/08/18 2033 97.8 F (36.6 C)     Temp Source 09/08/18 2033 Oral     SpO2 09/08/18 2033 98 %     Weight 09/08/18 2034 170 lb (77.1 kg)     Height 09/08/18 2034 5\' 10"  (1.778 m)     Head Circumference --      Peak Flow --      Pain Score 09/08/18 2033 6   Constitutional: Slightly somnolent, awakens to verbal stimuli.  Eyes:  Bilateral injected conjunctiva, 2 mm pupils that react. No obvious foreign body.  ENT      Head: Normocephalic and atraumatic.      Nose: No congestion/rhinnorhea.      Mouth/Throat: Mucous membranes are moist.      Neck: No stridor. Hematological/Lymphatic/Immunilogical: No cervical lymphadenopathy. Cardiovascular: Normal rate, regular rhythm.  No murmurs, rubs, or gallops.  Respiratory: Normal respiratory effort without tachypnea nor retractions. Breath sounds are clear and equal bilaterally. No wheezes/rales/rhonchi. Gastrointestinal: Soft and non tender. No rebound. No guarding.  Genitourinary: Deferred Musculoskeletal: Normal range of motion in all extremities. No lower extremity edema. Neurologic:  Slightly somnolent, awakens to verbal stimuli. Moves all extremities Skin:  Skin is warm, dry and intact. No rash noted.  ____________________________________________    LABS (pertinent positives/negatives)  None  ____________________________________________   EKG  None  ____________________________________________    RADIOLOGY  None  ____________________________________________   PROCEDURES  Procedures  ____________________________________________   INITIAL IMPRESSION / ASSESSMENT AND PLAN / ED COURSE  Pertinent labs & imaging results that were available during my care of the patient were reviewed by me and considered in my medical decision making (see chart for details).   Patient presented to the emergency department today because of concern for bilateral eye pain. Patient says that he was sanding sheet rock at 1pm. Went to urgent care this evening and says that they started irrigating his eyes. On exam here patient is somewhat somnolent but does awaken to verbal stimuli. He was then started on morgan lens with irrigation. Unfortunately no in date pH testing strips were found so plan was for large volume irrigation. The patient did not tolerate the morgan lens.  This did appear to wake him up. He stated that he wanted to leave after getting tetracaine drops. I had a long discussion with the patient about my concern for permanent and significant eye damage including permanent blinding. Patient was able to verbalize back my concerns. Denies any drug or alcohol use tonight and ethanol was negative. He certainly had become more awake after the morgan lens. At this time do  feel he is competent to make his own medical decision and he was able to verbalize back to me my concern. Will prescribe patient erythromycin ointment. Discussed importance of close follow up with ophthalmology.  ____________________________________________   FINAL CLINICAL IMPRESSION(S) / ED DIAGNOSES  Final diagnoses:  Eye irritation     Note: This dictation was prepared with Dragon dictation. Any transcriptional errors that result from this process are unintentional     Phineas SemenGoodman, Renesme Kerrigan, MD 09/09/18 [phone]

## 2018-09-08 NOTE — ED Triage Notes (Signed)
Pt comes straight back to room 8 with concerns over inability to triage pt up front due to pt falling asleep and having slurred speech. Pt reports he is here due to concerns over "something being stuck in pt's eye." Pt reports he went to [facility] earlier and has eyes flushed out. Pt reports he was told to come to the ED for further evaluation/treatment.

## 2018-09-08 NOTE — ED Notes (Signed)
Pt unable to tolerate morgan lenses. Manual flushing on the eyes; pt demands we stop despite risk/benefit. Pt's right eye only flushed with 150 cc

## 2019-03-31 ENCOUNTER — Emergency Department
Admission: EM | Admit: 2019-03-31 | Discharge: 2019-03-31 | Disposition: A | Discharge status: Home / Self Care | Payer: Self-pay | Attending: Emergency Medicine | Admitting: Emergency Medicine

## 2019-03-31 ENCOUNTER — Other Ambulatory Visit: Payer: Self-pay

## 2019-03-31 DIAGNOSIS — Y929 Unspecified place or not applicable: Secondary | ICD-10-CM | POA: Insufficient documentation

## 2019-03-31 DIAGNOSIS — Y939 Activity, unspecified: Secondary | ICD-10-CM | POA: Insufficient documentation

## 2019-03-31 DIAGNOSIS — F17210 Nicotine dependence, cigarettes, uncomplicated: Secondary | ICD-10-CM | POA: Insufficient documentation

## 2019-03-31 DIAGNOSIS — Y999 Unspecified external cause status: Secondary | ICD-10-CM | POA: Insufficient documentation

## 2019-03-31 DIAGNOSIS — S61211A Laceration without foreign body of left index finger without damage to nail, initial encounter: Secondary | ICD-10-CM | POA: Insufficient documentation

## 2019-03-31 DIAGNOSIS — Z79899 Other long term (current) drug therapy: Secondary | ICD-10-CM | POA: Insufficient documentation

## 2019-03-31 DIAGNOSIS — W298XXA Contact with other powered powered hand tools and household machinery, initial encounter: Secondary | ICD-10-CM | POA: Insufficient documentation

## 2019-03-31 NOTE — ED Notes (Signed)
Pt's left hand has been soaked it sterile water and soap. Pt tolerating well.
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
## 2019-03-31 NOTE — ED Triage Notes (Signed)
Pt arrived via [facility] from [facility] with left index finger laceration. Pt was operating a power washer when it slipped and cut his finger. Blood was squirting out per pt and ems so finger has been wrapped in gauze with minimal blood loss.

## 2019-05-16 ENCOUNTER — Other Ambulatory Visit: Payer: Self-pay

## 2019-05-16 ENCOUNTER — Emergency Department (HOSPITAL_COMMUNITY)
Admission: EM | Admit: 2019-05-16 | Discharge: 2019-05-16 | Disposition: A | Discharge status: Home / Self Care | Payer: Self-pay | Attending: Emergency Medicine | Admitting: Emergency Medicine

## 2019-05-16 ENCOUNTER — Encounter (HOSPITAL_COMMUNITY): Payer: Self-pay | Admitting: Emergency Medicine

## 2019-05-16 ENCOUNTER — Emergency Department (HOSPITAL_COMMUNITY): Payer: Self-pay

## 2019-05-16 DIAGNOSIS — Z79899 Other long term (current) drug therapy: Secondary | ICD-10-CM | POA: Insufficient documentation

## 2019-05-16 DIAGNOSIS — Y929 Unspecified place or not applicable: Secondary | ICD-10-CM | POA: Insufficient documentation

## 2019-05-16 DIAGNOSIS — S81812A Laceration without foreign body, left lower leg, initial encounter: Secondary | ICD-10-CM | POA: Insufficient documentation

## 2019-05-16 DIAGNOSIS — Y999 Unspecified external cause status: Secondary | ICD-10-CM | POA: Insufficient documentation

## 2019-05-16 DIAGNOSIS — Y939 Activity, unspecified: Secondary | ICD-10-CM | POA: Insufficient documentation

## 2019-05-16 DIAGNOSIS — S8012XA Contusion of left lower leg, initial encounter: Secondary | ICD-10-CM | POA: Insufficient documentation

## 2019-05-16 DIAGNOSIS — F17210 Nicotine dependence, cigarettes, uncomplicated: Secondary | ICD-10-CM | POA: Insufficient documentation

## 2019-05-16 MED ORDER — ACETAMINOPHEN 500 MG PO TABS
1000.0000 mg | ORAL_TABLET | Freq: Once | ORAL | Status: AC
  Administered 2019-05-16: 20:00:00 1000 mg via ORAL
  Filled 2019-05-16: qty 2
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
## 2019-05-16 NOTE — ED Notes (Signed)
Patient verbalizes understanding of discharge instructions. Opportunity for questioning and answers were provided. Armband removed by staff, pt discharged from ED.  

## 2019-05-16 NOTE — Discharge Instructions (Addendum)
It was our pleasure to provide your ER care today - we hope that you feel better.  Elevate leg. Icepack/coldpack to sore area. Keep wound very clean - wash with soap and water 1-2x/day, and change sterile dressing 1-2x/day.   Take acetaminophen or ibuprofen as need for pain.   Follow up with primary care doctor in the coming week if symptoms fail to improve/resolve.  Return to ER right away if worse, new symptoms, worsening or severe pain, numbness/weakness, increased or severe swelling, spreading redness, pus from wound, fever, or other concern.
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
## 2020-02-22 IMAGING — CR DG TIBIA/FIBULA 2V*L*
4 series · 4 of 4 positions shown · non-contrast
Comparison: None.

CLINICAL DATA: Status post trauma.

EXAM:
LEFT TIBIA AND FIBULA - 2 VIEW

[tibia ap (1 of 2)]
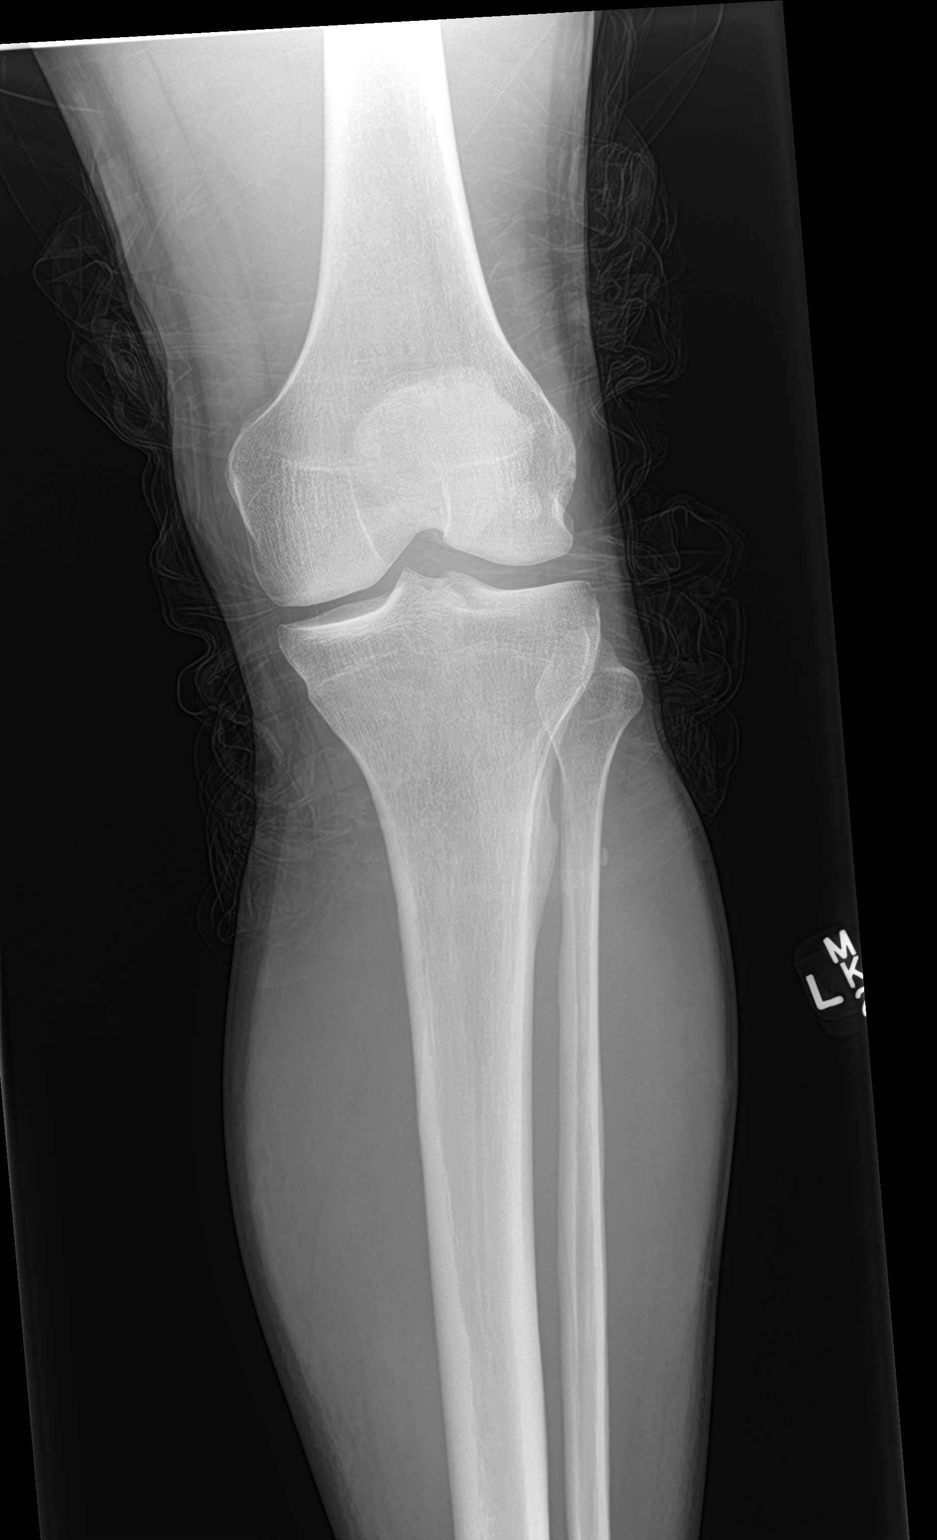

[tibia ap (2 of 2)]
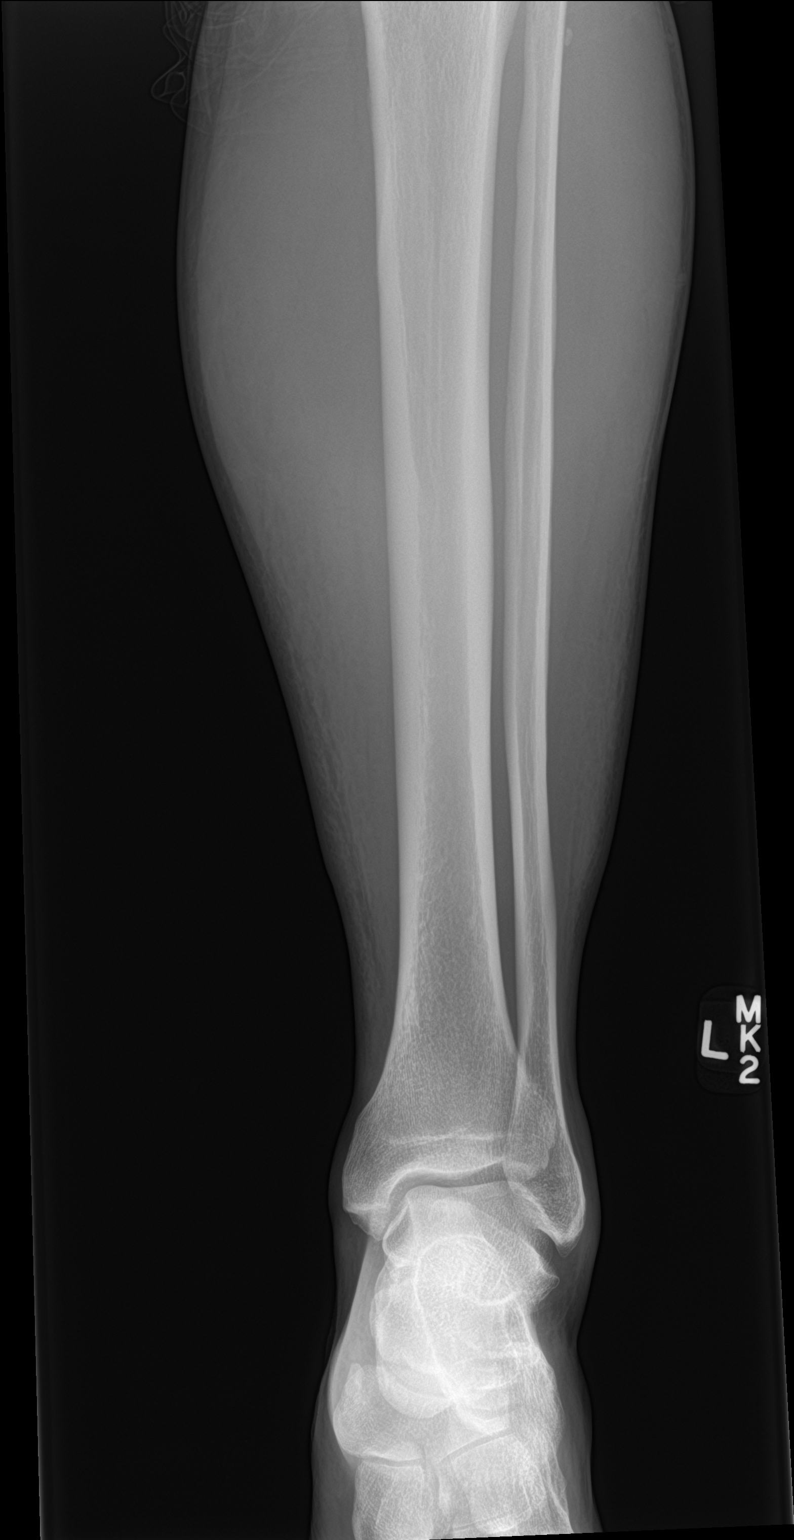

[tibia lat (1 of 2)]
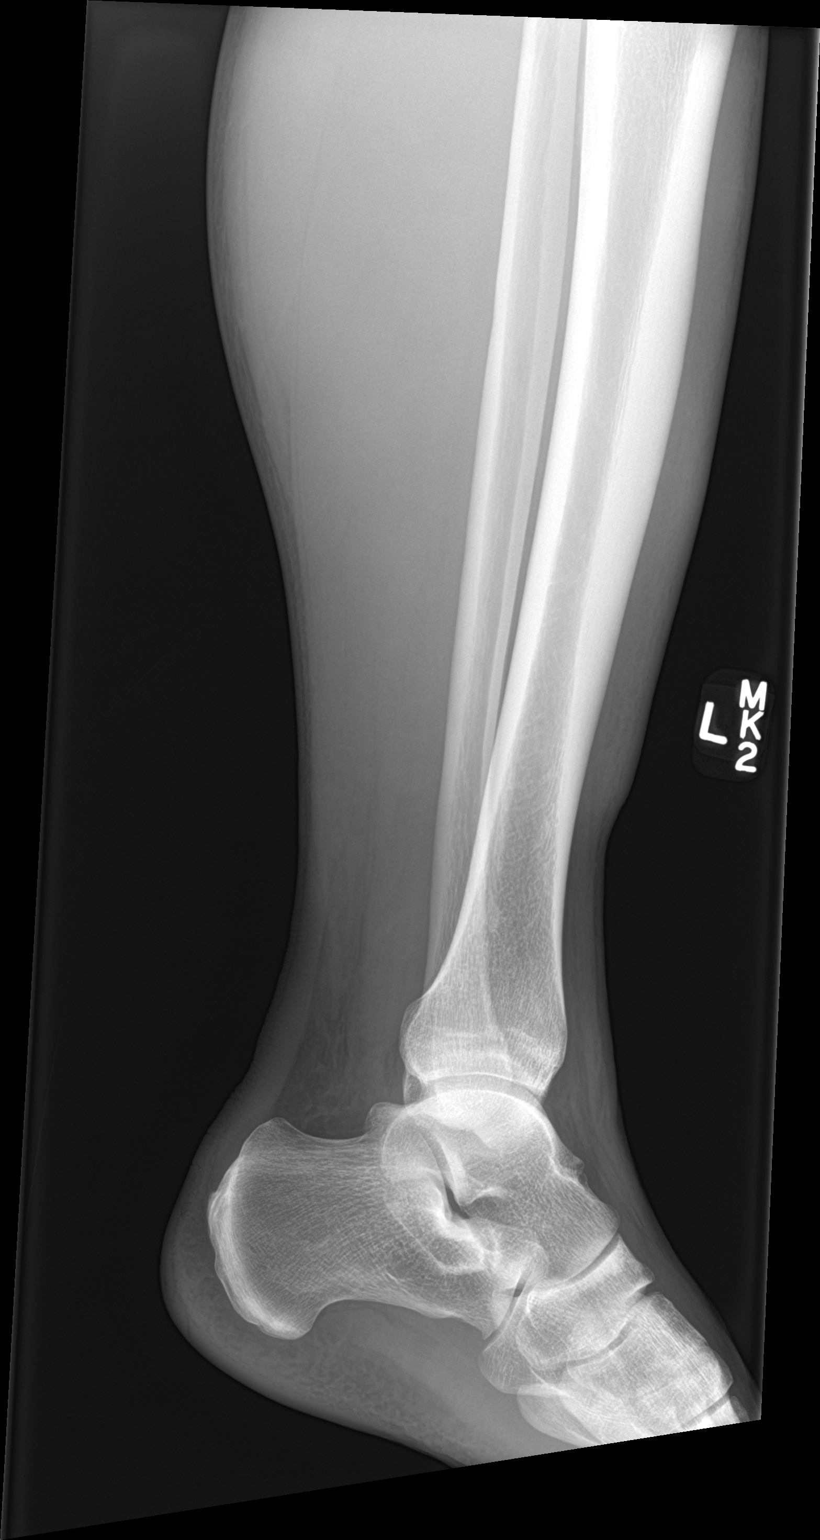

[tibia lat (2 of 2)]
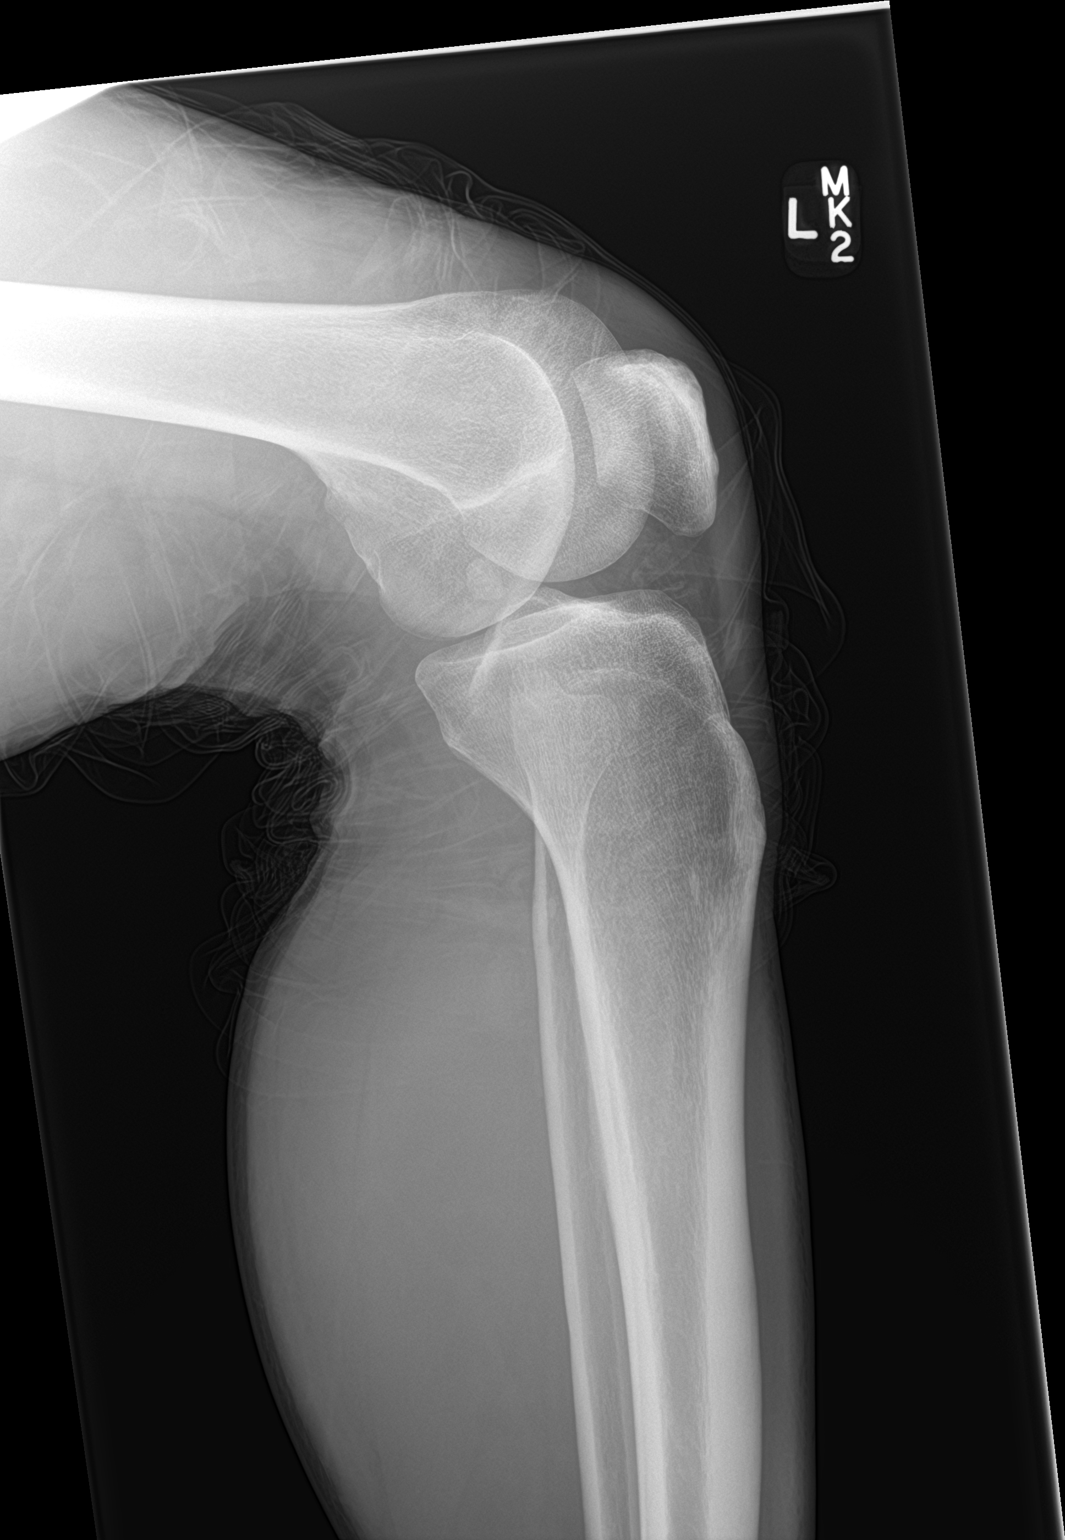

[4 of 4 positions shown; findings below may reference images not displayed]

FINDINGS: There is no evidence of fracture or other focal bone lesions. Soft
tissues are unremarkable.
IMPRESSION: Negative.

## 2022-06-23 ENCOUNTER — Other Ambulatory Visit: Payer: Self-pay

## 2022-06-23 ENCOUNTER — Emergency Department: Payer: 59

## 2022-06-23 ENCOUNTER — Encounter: Payer: Self-pay | Admitting: Family Medicine

## 2022-06-23 ENCOUNTER — Observation Stay
Admission: EM | Admit: 2022-06-23 | Discharge: 2022-06-23 | Disposition: A | Discharge status: Home / Self Care | Payer: 59 | Attending: Emergency Medicine | Admitting: Emergency Medicine

## 2022-06-23 DIAGNOSIS — F17210 Nicotine dependence, cigarettes, uncomplicated: Secondary | ICD-10-CM | POA: Insufficient documentation

## 2022-06-23 DIAGNOSIS — L0390 Cellulitis, unspecified: Secondary | ICD-10-CM | POA: Diagnosis present

## 2022-06-23 DIAGNOSIS — Z720 Tobacco use: Secondary | ICD-10-CM

## 2022-06-23 DIAGNOSIS — L03116 Cellulitis of left lower limb: Secondary | ICD-10-CM | POA: Diagnosis not present

## 2022-06-23 DIAGNOSIS — F1910 Other psychoactive substance abuse, uncomplicated: Secondary | ICD-10-CM | POA: Diagnosis present

## 2022-06-23 LAB — CBC WITH DIFFERENTIAL/PLATELET
Abs Immature Granulocytes: 0.04 10*3/uL (ref 0.00–0.07)
Basophils Absolute: 0.1 10*3/uL (ref 0.0–0.1)
Basophils Relative: 0 %
Eosinophils Absolute: 0.6 10*3/uL — ABNORMAL HIGH (ref 0.0–0.5)
Eosinophils Relative: 5 %
HCT: 42.4 % (ref 39.0–52.0)
Hemoglobin: 13.7 g/dL (ref 13.0–17.0)
Immature Granulocytes: 0 %
Lymphocytes Relative: 12 %
Lymphs Abs: 1.4 10*3/uL (ref 0.7–4.0)
MCH: 28.4 pg (ref 26.0–34.0)
MCHC: 32.3 g/dL (ref 30.0–36.0)
MCV: 87.8 fL (ref 80.0–100.0)
Monocytes Absolute: 0.9 10*3/uL (ref 0.1–1.0)
Monocytes Relative: 8 %
Neutro Abs: 8.9 10*3/uL — ABNORMAL HIGH (ref 1.7–7.7)
Neutrophils Relative %: 75 %
Platelets: 261 10*3/uL (ref 150–400)
RBC: 4.83 MIL/uL (ref 4.22–5.81)
RDW: 12.8 % (ref 11.5–15.5)
WBC: 11.9 10*3/uL — ABNORMAL HIGH (ref 4.0–10.5)
nRBC: 0 % (ref 0.0–0.2)

## 2022-06-23 LAB — BASIC METABOLIC PANEL
Anion gap: 10 (ref 5–15)
BUN: 16 mg/dL (ref 6–20)
CO2: 29 mmol/L (ref 22–32)
Calcium: 8.9 mg/dL (ref 8.9–10.3)
Chloride: 96 mmol/L — ABNORMAL LOW (ref 98–111)
Creatinine, Ser: 0.88 mg/dL (ref 0.61–1.24)
GFR, Estimated: >60 mL/min (ref >60)
Glucose, Bld: 105 mg/dL — ABNORMAL HIGH (ref 70–99)
Potassium: 3.5 mmol/L (ref 3.5–5.1)
Sodium: 135 mmol/L (ref 135–145)

## 2022-06-23 LAB — URINE DRUG SCREEN, QUALITATIVE (ARMC ONLY)
Amphetamines, Ur Screen: POSITIVE — AB
Barbiturates, Ur Screen: NOT DETECTED
Benzodiazepine, Ur Scrn: POSITIVE — AB
Cannabinoid 50 Ng, Ur ~~LOC~~: NOT DETECTED
Cocaine Metabolite,Ur ~~LOC~~: NOT DETECTED
MDMA (Ecstasy)Ur Screen: NOT DETECTED
Methadone Scn, Ur: NOT DETECTED
Opiate, Ur Screen: NOT DETECTED
Phencyclidine (PCP) Ur S: NOT DETECTED
Tricyclic, Ur Screen: NOT DETECTED

## 2022-06-23 LAB — LACTIC ACID, PLASMA
Lactic Acid, Venous: 1.5 mmol/L (ref 0.5–1.9)
Lactic Acid, Venous: 1.2 mmol/L (ref 0.5–1.9)

## 2022-06-23 LAB — CBC
HCT: 40.3 % (ref 39.0–52.0)
Hemoglobin: 13.3 g/dL (ref 13.0–17.0)
MCH: 28.9 pg (ref 26.0–34.0)
MCHC: 33 g/dL (ref 30.0–36.0)
MCV: 87.4 fL (ref 80.0–100.0)
Platelets: 229 10*3/uL (ref 150–400)
RBC: 4.61 MIL/uL (ref 4.22–5.81)
RDW: 13 % (ref 11.5–15.5)
WBC: 11.4 10*3/uL — ABNORMAL HIGH (ref 4.0–10.5)
nRBC: 0 % (ref 0.0–0.2)

## 2022-06-23 LAB — HIV ANTIBODY (ROUTINE TESTING W REFLEX): HIV Screen 4th Generation wRfx: NONREACTIVE

## 2022-06-23 LAB — SEDIMENTATION RATE: Sed Rate: 25 mm/hr — ABNORMAL HIGH (ref 0–15)

## 2022-06-23 MED ORDER — VANCOMYCIN HCL 750 MG/150ML IV SOLN
750.0000 mg | Freq: Once | INTRAVENOUS | Status: AC
  Administered 2022-06-23: 750 mg via INTRAVENOUS
  Filled 2022-06-23: qty 150

## 2022-06-23 MED ORDER — ENOXAPARIN SODIUM 40 MG/0.4ML IJ SOSY: 40.0000 mg | PREFILLED_SYRINGE | INTRAMUSCULAR | Status: DC

## 2022-06-23 MED ORDER — VANCOMYCIN HCL IN DEXTROSE 1-5 GM/200ML-% IV SOLN
1000.0000 mg | Freq: Once | INTRAVENOUS | Status: AC
  Administered 2022-06-23: 1000 mg via INTRAVENOUS
  Filled 2022-06-23: qty 200

## 2022-06-23 MED ORDER — ONDANSETRON HCL 4 MG/2ML IJ SOLN: 4.0000 mg | Freq: Four times a day (QID) | INTRAMUSCULAR | Status: DC | PRN

## 2022-06-23 MED ORDER — VANCOMYCIN HCL 1250 MG/250ML IV SOLN
1250.0000 mg | Freq: Two times a day (BID) | INTRAVENOUS | Status: DC
  Filled 2022-06-23: qty 250

## 2022-06-23 MED ORDER — ONDANSETRON HCL 4 MG PO TABS: 4.0000 mg | ORAL_TABLET | Freq: Four times a day (QID) | ORAL | Status: DC | PRN

## 2022-06-23 MED ORDER — ACETAMINOPHEN 325 MG PO TABS
650.0000 mg | ORAL_TABLET | Freq: Four times a day (QID) | ORAL | Status: DC | PRN
  Filled 2022-06-23: qty 2
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
## 2022-06-23 NOTE — H&P (Addendum)
History and Physical    Patient: Johnathan Brown [id number] DOB: 04-14-1976 DOA: 06/23/2022 DOS: the patient was seen and examined on 06/23/2022 PCP: Patient, No Pcp Per  Patient coming from: Home  Chief Complaint:  Chief Complaint  Patient presents with   Knee Pain   HPI: Johnathan Brown is a 47 y.o. male with medical history significant for IVDU, GERD, tobacco abuse presenting with left lower extremity cellulitis.  Patient reports having worsening right lower extremity redness and swelling for past 6 to 7 days.  Patient unclear of any direct trauma though reports remote scratching of area 3 to 4 weeks ago.  Area developed progressively worsening redness and swelling over the past 2 to 3 days.  Mild chills.  No nausea or vomiting.  No chest pain or shortness of breath.  Baseline IV drug use with IV heroin.  Uses approximately 3 times a day.  Denies any marijuana or cocaine use.  Positive tobacco abuse.  Patient states he is ready to quit.  No hemiparesis or confusion.  Patient states he has been trying to drain the area with no improvement.Pt states that he used a friend's antibiotic with no improvement (?amoxicillin) early in course of redness.  Presented to the ER afebrile, hemodynamically stable.  White count 12, hemoglobin 14.  Creatinine 0.9.  Sed rate 25.  Tib-fib films grossly within normal limits.  Review of Systems: As mentioned in the history of present illness. All other systems reviewed and are negative. Past Medical History:  Diagnosis Date   Abscess 01/2015   GERD (gastroesophageal reflux disease)    Past Surgical History:  Procedure Laterality Date   I & D EXTREMITY Right 01/25/2015   Procedure: MINOR IRRIGATION AND DEBRIDEMENT RIGHT ARM;  Surgeon: Newt Minion, MD;  Location: [facility];  Service: Orthopedics;  Laterality: Right;   INNER EAR SURGERY     x 5    Social History:  reports that he has been smoking cigarettes. He has a 7.50 pack-year smoking history. He has never  used smokeless tobacco. He reports that he does not drink alcohol and does not use drugs.  No Known Allergies  No family history on file.  Prior to Admission medications   Medication Sig Start Date End Date Taking? Authorizing Provider  amphetamine-dextroamphetamine (ADDERALL) 20 MG tablet Take 20 mg by mouth 2 (two) times daily.   Yes [provider]  buprenorphine (SUBUTEX) 8 MG SUBL SL tablet Place 8 mg under the tongue daily.   Yes [provider]    Physical Exam: Vitals:   06/23/22 1230 06/23/22 1300 06/23/22 1330 06/23/22 1400  BP: 115/81 118/82 132/89 115/80  Pulse: 87 88 67 (!) 104  Resp: '16 15 18 17  '$ Temp:      SpO2: 98% 100% 100% 100%  Weight:      Height:       Physical Exam Constitutional:      Appearance: He is normal weight.  HENT:     Head: Normocephalic.     Nose: Nose normal.     Mouth/Throat:     Mouth: Mucous membranes are moist.  Eyes:     Pupils: Pupils are equal, round, and reactive to light.  Cardiovascular:     Rate and Rhythm: Normal rate and regular rhythm.  Pulmonary:     Effort: Pulmonary effort is normal.     Breath sounds: Normal breath sounds.  Abdominal:     General: Bowel sounds are normal.  Musculoskeletal:  Comments: + LLE redness over anterior knee and midshin  + TTP over areas of erythema  4/5 general ROM of L knee  No overt intra-articular swelling   Neurological:     General: No focal deficit present.  Psychiatric:        Mood and Affect: Mood normal.         Data Reviewed:  There are no new results to review at this time. DG Knee Complete 4 Views Left CLINICAL DATA:  Cellulitis.  Pain.  EXAM: LEFT KNEE - COMPLETE 4+ VIEW  COMPARISON:  None Available.  FINDINGS: Anterior soft tissue swelling identified. No joint effusion. No fracture or dislocation. No degenerative change. No bony erosion.  IMPRESSION: Anterior soft tissue swelling. No other abnormalities.  Electronically Signed    By: Dorise Bullion III M.D.   On: 06/23/2022 14:03   Lab Results  Component Value Date   WBC 11.9 (H) 06/23/2022   HGB 13.7 06/23/2022   HCT 42.4 06/23/2022   MCV 87.8 06/23/2022   PLT 261 [id number]   Last metabolic panel Lab Results  Component Value Date   GLUCOSE 105 (H) 06/23/2022   NA 135 06/23/2022   K 3.5 06/23/2022   CL 96 (L) 06/23/2022   CO2 29 06/23/2022   BUN 16 06/23/2022   CREATININE 0.88 06/23/2022   GFRNONAA >60 06/23/2022   CALCIUM 8.9 06/23/2022   PROT 5.7 (L) 01/27/2015   ALBUMIN 3.1 (L) 01/27/2015   BILITOT 0.7 01/27/2015   ALKPHOS 58 01/27/2015   AST 16 01/27/2015   ALT 12 (L) 01/27/2015   ANIONGAP 10 06/23/2022    Assessment and Plan: * Cellulitis Noted left lower extremity redness swelling and erythema from the anterior knee down to the mid shin Question trauma associated with outdoor activity versus insect bite Does appear to be a fair amount of redness and swelling however, there are no overt signs of joint involvement concerning for septic arthritis at present though this will need to be closely monitored Left knee plain films are pending Started on IV vancomycin in the setting of active IV drug use Blood cultures drawn in the ER  Patient is forthcoming about his IV heroin use and denies any direct injections to the affected area Sed rate is minimally elevated at 25 Will otherwise follow for now [place] and date the affected area   Substance abuse ([place]) Active IV drug abuse with IV heroin  Patient states he uses 3 times a day Patient is very forthcoming asked about his drug use and wishes to quit Discussed cessation Will formally consult social work for potential resources Check urine drug screen Withdrawal protocol as needed Follow      Advance Care Planning:   Code Status: Full Code   Consults: none   Family Communication: No family at the bedside   Severity of Illness: The appropriate patient status for this patient is  OBSERVATION. Observation status is judged to be reasonable and necessary in order to provide the required intensity of service to ensure the patient's safety. The patient's presenting symptoms, physical exam findings, and initial radiographic and laboratory data in the context of their medical condition is felt to place them at decreased risk for further clinical deterioration. Furthermore, it is anticipated that the patient will be medically stable for discharge from the hospital within 2 midnights of admission.   Author: Deneise Lever, MD 06/23/2022 2:24 PM  For on call review [email or web address].

## 2022-06-23 NOTE — Progress Notes (Signed)
Patient called out advising that he needed pain medication, when RN took patient PRN tylenol patient advised that tylenol would not do anything for him and that he was probably going to be leaving in a little while because he was not going to sit here with the type of pain he was experiencing.  Patient then left his room, advised in hallway that that would need to sign AMA form. Which he did sign however when I advised that I would need to remove his IV, he became upset and yelling that I would not touch him.  Once arrived the elevator RN was able to get him to agree to have another staff member remove his IV, IV was removed and gauze was applied to IV access site.  Patient left ambulatory with all belongings.  Provider on call notified.

## 2022-06-23 NOTE — Assessment & Plan Note (Signed)
Active IV drug abuse with IV heroin  Patient states he uses 3 times a day Patient is very forthcoming asked about his drug use and wishes to quit Discussed cessation Will formally consult social work for potential resources Check urine drug screen Withdrawal protocol as needed Follow

## 2022-06-23 NOTE — ED Notes (Signed)
Pt has complaints of left knee pain which started on Monday. By Tuesday the knee started swelling. Pt opened the skin on Wednesday to try to get the infection out and stated that he "took 6 or 7 pills that you take for bladder infection that were prescribed for his stepmom." On Thursday and Friday pt open the wound up farther to remove the infection. Last night pt stated he took "antibiotics that they give you for your teeth, which were prescribed for a friend and took about 6 of 'em." This morning when in shower pt stated he put hot water on the wound on his left knee and a "string that came out and stretched down to my toes. I could pick it up and move it around." Upon inspection, pt's left knee is swollen, red, and hot to the touch. There is swelling/edema on left leg down to the left ankle.

## 2022-06-23 NOTE — Consult Note (Signed)
Pharmacy Antibiotic Note  Johnathan Brown is a 47 y.o. male admitted on 06/23/2022 with  left knee pain / wound concerning for infection .  Pharmacy has been consulted for vancomycin dosing.  Plan:  Vancomycin 1.75 g (1 g + 750 mg) LD followed by vancomycin 1.25 g IV q12h --Calculated AUC: 504, Cmin: 13 --Daily Scr per protocol --Levels at steady state or as clinically indicated  Height: '5\' 10"'$  (177.8 cm) Weight: 72.6 kg (160 lb 0.9 oz) IBW/kg (Calculated) : 73  Temp (24hrs), Avg:97.9 F (36.6 C), Min:97.9 F (36.6 C), Max:97.9 F (36.6 C)  Recent Labs  Lab 06/23/22 1144  WBC 11.9*  CREATININE 0.88  LATICACIDVEN 1.5    Estimated Creatinine Clearance: 108.9 mL/min (by C-G formula based on SCr of 0.88 mg/dL).    No Known Allergies  Antimicrobials this admission: Vancomycin 3/2 >>   Dose adjustments this admission: N/A  Microbiology results: 3/ BCx: pending  Thank you for allowing pharmacy to be a part of this patient's care.  Benita Gutter 06/23/2022 1:53 PM

## 2022-06-23 NOTE — Assessment & Plan Note (Addendum)
Noted left lower extremity redness swelling and erythema from the anterior knee down to the mid shin Question trauma associated with outdoor activity versus insect bite Does appear to be a fair amount of redness and swelling however, there are no overt signs of joint involvement concerning for septic arthritis at present though this will need to be closely monitored Left knee plain films are pending Started on IV vancomycin in the setting of active IV drug use Blood cultures drawn in the ER  Patient is forthcoming about his IV heroin use and denies any direct injections to the affected area Sed rate is minimally elevated at 25 Will otherwise follow for now [place] and date the affected area

## 2022-06-23 NOTE — Progress Notes (Signed)
       CROSS COVER NOTE  NAME: Johnathan Brown MRN: [id number] DOB : [birth year]    HPI/Events of Note   Informed by nursing staff patient upset about pain med offered to him so he was leaving. Nurse reports she had to follow him down the hallway to the elevator to remove peripheral access and have him sign AMA form.   Assessment and  Interventions   Assessment: On arrival to unit patient already had left Per nurse patient understood risk of worsening infection and death. Patient stated to the nurse he was going to follow up at [place] fear       Kathlene Cote NP [facility]

## 2022-06-23 NOTE — ED Triage Notes (Signed)
Pt here with left knee pain that started x3 days. Pt's knee is red and swollen. Pt states he fell last week and then the pain started to move up his leg to his knee. Pt thought it started from a spider bite but now is much worse per pt.

## 2022-06-26 LAB — CULTURE, BLOOD (ROUTINE X 2)

## 2022-06-27 ENCOUNTER — Emergency Department (HOSPITAL_COMMUNITY)
Admission: EM | Admit: 2022-06-27 | Discharge: 2022-06-27 | Disposition: A | Discharge status: Home / Self Care | Payer: 59 | Attending: Emergency Medicine | Admitting: Emergency Medicine

## 2022-06-27 ENCOUNTER — Emergency Department (HOSPITAL_COMMUNITY): Payer: 59

## 2022-06-27 ENCOUNTER — Encounter (HOSPITAL_COMMUNITY): Payer: Self-pay

## 2022-06-27 ENCOUNTER — Other Ambulatory Visit: Payer: Self-pay

## 2022-06-27 ENCOUNTER — Ambulatory Visit (HOSPITAL_COMMUNITY)
Admission: EM | Admit: 2022-06-27 | Discharge: 2022-06-27 | Disposition: A | Discharge status: Home / Self Care | Payer: 59

## 2022-06-27 ENCOUNTER — Ambulatory Visit (HOSPITAL_COMMUNITY): Admit: 2022-06-27 | Payer: Self-pay

## 2022-06-27 DIAGNOSIS — L03116 Cellulitis of left lower limb: Secondary | ICD-10-CM

## 2022-06-27 LAB — CBC WITH DIFFERENTIAL/PLATELET
Abs Immature Granulocytes: 0.04 10*3/uL (ref 0.00–0.07)
Basophils Absolute: 0.1 10*3/uL (ref 0.0–0.1)
Basophils Relative: 1 %
Eosinophils Absolute: 0.4 10*3/uL (ref 0.0–0.5)
Eosinophils Relative: 6 %
HCT: 37.8 % — ABNORMAL LOW (ref 39.0–52.0)
Hemoglobin: 12.7 g/dL — ABNORMAL LOW (ref 13.0–17.0)
Immature Granulocytes: 1 %
Lymphocytes Relative: 25 %
Lymphs Abs: 1.8 10*3/uL (ref 0.7–4.0)
MCH: 29.1 pg (ref 26.0–34.0)
MCHC: 33.6 g/dL (ref 30.0–36.0)
MCV: 86.7 fL (ref 80.0–100.0)
Monocytes Absolute: 0.7 10*3/uL (ref 0.1–1.0)
Monocytes Relative: 9 %
Neutro Abs: 4.3 10*3/uL (ref 1.7–7.7)
Neutrophils Relative %: 58 %
Platelets: 313 10*3/uL (ref 150–400)
RBC: 4.36 MIL/uL (ref 4.22–5.81)
RDW: 12.4 % (ref 11.5–15.5)
WBC: 7.3 10*3/uL (ref 4.0–10.5)
nRBC: 0 % (ref 0.0–0.2)

## 2022-06-27 LAB — BASIC METABOLIC PANEL
Anion gap: 5 (ref 5–15)
BUN: 21 mg/dL — ABNORMAL HIGH (ref 6–20)
CO2: 26 mmol/L (ref 22–32)
Calcium: 8.3 mg/dL — ABNORMAL LOW (ref 8.9–10.3)
Chloride: 103 mmol/L (ref 98–111)
Creatinine, Ser: 0.74 mg/dL (ref 0.61–1.24)
GFR, Estimated: >60 mL/min (ref >60)
Glucose, Bld: 109 mg/dL — ABNORMAL HIGH (ref 70–99)
Potassium: 3.7 mmol/L (ref 3.5–5.1)
Sodium: 134 mmol/L — ABNORMAL LOW (ref 135–145)

## 2022-06-27 LAB — LACTIC ACID, PLASMA: Lactic Acid, Venous: 1.1 mmol/L (ref 0.5–1.9)

## 2022-06-27 MED ORDER — DOXYCYCLINE HYCLATE 100 MG PO CAPS: 100.0000 mg | ORAL_CAPSULE | Freq: Two times a day (BID) | ORAL | 0 refills | Status: DC

## 2022-06-27 MED ORDER — VANCOMYCIN HCL 1500 MG/300ML IV SOLN
1500.0000 mg | Freq: Once | INTRAVENOUS | Status: AC
  Administered 2022-06-27: 1500 mg via INTRAVENOUS
  Filled 2022-06-27: qty 300

## 2022-06-27 NOTE — ED Provider Notes (Signed)
[facility]    CSN: [id number] Arrival date & time: 06/27/22  1137      History   Chief Complaint Chief Complaint  Patient presents with   Joint Swelling   Leg Swelling    HPI Johnathan Brown is a 47 y.o. male.   Patient presents to clinic for ongoing cellulitis to left leg.  Recently left [facility] AMA om 06/23/2022 as he felt his pain was not being adequately controlled.  Was getting IV antibiotics with vancomycin.  Reports he is taken about 3 doses of some leftover doxycycline he has had at the house.  Area is draining purulent drainage to anterior patella.  Reports he thinks it is improving, however, compared to previous imaging erythema, warmth and edema have spread. Denies fevers or current pain. Ambulatory.   Hx of IVDU, abscess and cellulitis.   The history is provided by the patient and medical records.    Past Medical History:  Diagnosis Date   Abscess 01/2015   GERD (gastroesophageal reflux disease)     Patient Active Problem List   Diagnosis Date Noted   Cellulitis 06/23/2022   Abscess of right upper extremity 01/25/2015   Chronic low back pain 01/25/2015   Substance abuse ([place]) 01/25/2015   Tobacco abuse 01/25/2015   Abscess of upper extremity 01/25/2015   Abscess of arm, right    Right arm cellulitis     Past Surgical History:  Procedure Laterality Date   I & D EXTREMITY Right 01/25/2015   Procedure: MINOR IRRIGATION AND DEBRIDEMENT RIGHT ARM;  Surgeon: Newt Minion, MD;  Location: [facility];  Service: Orthopedics;  Laterality: Right;   INNER EAR SURGERY     x 5        Home Medications    Prior to Admission medications   Medication Sig Start Date End Date Taking? Authorizing Provider  amphetamine-dextroamphetamine (ADDERALL) 20 MG tablet Take 20 mg by mouth 2 (two) times daily.    [provider]  buprenorphine (SUBUTEX) 8 MG SUBL SL tablet Place 8 mg under the tongue daily.    [provider]    Family History Family  History  Problem Relation Age of Onset   Hypertension Father    Diabetes Father    Crohn's disease Father     Social History Social History   Tobacco Use   Smoking status: Every Day    Packs/day: 0.50    Years: 15.00    Total pack years: 7.50    Types: Cigarettes   Smokeless tobacco: Never  Vaping Use   Vaping Use: Some days   Substances: Nicotine, Flavoring  Substance Use Topics   Alcohol use: No   Drug use: Not Currently     Allergies   Patient has no known allergies.   Review of Systems Review of Systems  Constitutional:  Negative for chills and fever.  HENT:  Negative for ear pain and sore throat.   Eyes:  Negative for pain and visual disturbance.  Respiratory:  Negative for cough and shortness of breath.   Cardiovascular:  Negative for chest pain and palpitations.  Gastrointestinal:  Negative for abdominal pain and vomiting.  Genitourinary:  Negative for dysuria and hematuria.  Musculoskeletal:  Positive for joint swelling. Negative for arthralgias and back pain.  Skin:  Positive for color change and wound.  Neurological:  Negative for seizures and syncope.  All other systems reviewed and are negative.    Physical Exam Triage Vital Signs ED Triage Vitals [  06/27/22 1152]  Enc Vitals Group     BP 124/70     Pulse Rate 92     Resp 16     Temp (!) 97.4 F (36.3 C)     Temp Source Oral     SpO2 97 %     Weight      Height      Head Circumference      Peak Flow      Pain Score 0     Pain Loc      Pain Edu?      Excl. in [place]?    No data found.  Updated Vital Signs BP 124/70 (BP Location: Left Arm)   Pulse 92   Temp (!) 97.4 F (36.3 C) (Oral)   Resp 16   SpO2 97%   Visual Acuity Right Eye Distance:   Left Eye Distance:   Bilateral Distance:    Right Eye Near:   Left Eye Near:    Bilateral Near:     Physical Exam Vitals and nursing note reviewed.  Constitutional:      General: He is not in acute distress.    Appearance: He is  well-developed.  HENT:     Head: Normocephalic and atraumatic.     Right Ear: External ear normal.     Left Ear: External ear normal.  Eyes:     Conjunctiva/sclera: Conjunctivae normal.  Cardiovascular:     Rate and Rhythm: Normal rate and regular rhythm.     Heart sounds: No murmur heard. Pulmonary:     Effort: Pulmonary effort is normal. No respiratory distress.  Musculoskeletal:        General: Swelling present.       Legs:     Comments: Left anterior patella with open lesion draining purulent fluid.  Erythema and edema have spread since admission, please see media for referral.  Skin:    General: Skin is warm and dry.     Capillary Refill: Capillary refill takes less than 2 seconds.     Findings: Erythema present.  Neurological:     Mental Status: He is alert.  Psychiatric:        Mood and Affect: Mood normal.        Behavior: Behavior is cooperative.      UC Treatments / Results  Labs (all labs ordered are listed, but only abnormal results are displayed) Labs Reviewed - No data to display  EKG   Radiology No results found.  Procedures Procedures (including critical care time)  Medications Ordered in UC Medications - No data to display  Initial Impression / Assessment and Plan / UC Course  I have reviewed the triage vital signs and the nursing notes.  Pertinent labs & imaging results that were available during my care of the patient were reviewed by me and considered in my medical decision making (see chart for details).  Vitals and triage note reviewed, patient appears hemodynamically stable.Afebrile. Denies pain. Cellulitis of LLE appears to be progressing since admission on 06/23/2022, see image below. Considered oral abx and strict ER management, however, swelling and erythema have progressed since hospital discharge with purulent drainage present. Discussed potential for sepsis, joint involvement, worsening infection and death, advised to head to nearest  ED for further work-up and potential resumption of IV abx. Discussed oral abx are not indicated at this time until further work-up has been obtained. Patient verbalized understanding, will head to nearest ED.     Media Information  Document Information  Photos    06/27/2022 12:09  Attached To:  Hospital Encounter on 06/27/22  Source [place], [place], [place]       Final Clinical Impressions(s) / UC Diagnoses   Final diagnoses:  Cellulitis of left lower extremity     Discharge Instructions      Your cellulitis is worsening.  I suggest you return to the emergency room for IV antibiotics.  If you choose not to return to the emergency room, I cautioned you as this infection might spread to joints and into your bloodstream causing sepsis and ultimately death.    ED Prescriptions   None    I have reviewed the PDMP during this encounter.   Adar Rase, [place], [place] 06/27/22 1228

## 2022-06-27 NOTE — Discharge Instructions (Addendum)
Return for any problem.   Complete entire course of doxycycline as prescribed.

## 2022-06-27 NOTE — Discharge Instructions (Addendum)
Your cellulitis is worsening.  I suggest you return to the emergency room for IV antibiotics.  If you choose not to return to the emergency room, I cautioned you as this infection might spread to joints and into your bloodstream causing sepsis and ultimately death.

## 2022-06-27 NOTE — ED Triage Notes (Signed)
C/o left knee swelling, redness, and heat. Wound noted to left knee with purulent drainage.  Let AMA on 06/23/22 and was receiving IV vancomycin in hospital.  Pt reports taking leftover doxycycline at home.  Denies fever/pain Ambulatory to triage.  Hx IVDU

## 2022-06-27 NOTE — Progress Notes (Signed)
A consult was received from an ED physician for vanc per pharmacy dosing.  The patient's profile has been reviewed for ht/wt/allergies/indication/available labs.   A one time order has been placed for vanc '1500mg'$ .  Further antibiotics/pharmacy consults should be ordered by admitting physician if indicated.                       Thank you, Kara Mead 06/27/2022  3:00 PM

## 2022-06-27 NOTE — ED Provider Notes (Signed)
[place] EMERGENCY DEPARTMENT AT [facility] Provider Note   CSN: [id number] Arrival date & time: 06/27/22  1351     History  Chief Complaint  Patient presents with   Wound Infection    DOIS BILLING is a 47 y.o. male.  47 year old male with prior medical history as detailed below presents for evaluation.  Patient presented to urgent care earlier today for evaluation of infection/cellulitis to the anterior left knee.  Patient reports cellulitis to the left lower extremity previously treated at [facility] earlier this month.  Patient left AMA from [facility] after apparently getting inadequate pain meds.  Prior infection was located in the mid shin.  This appears to have healed since his admission.  Patient denies recent IV drug abuse.  Patient reports that he is on Suboxone currently.  He denies fever.  He denies any specific injury.  He reports that he kneels frequently while at work.  The history is provided by the patient and medical records.       Home Medications Prior to Admission medications   Medication Sig Start Date End Date Taking? Authorizing Provider  amphetamine-dextroamphetamine (ADDERALL) 20 MG tablet Take 20 mg by mouth 2 (two) times daily.    [provider]  buprenorphine (SUBUTEX) 8 MG SUBL SL tablet Place 8 mg under the tongue daily.    [provider]      Allergies    Patient has no known allergies.    Review of Systems   Review of Systems  All other systems reviewed and are negative.   Physical Exam Updated Vital Signs BP (!) 119/95 (BP Location: Left Arm)   Pulse 95   Temp (!) 97.5 F (36.4 C) (Oral)   Resp 16   SpO2 98%  Physical Exam Vitals and nursing note reviewed.  Constitutional:      General: He is not in acute distress.    Appearance: Normal appearance. He is well-developed.  HENT:     Head: Normocephalic and atraumatic.  Eyes:     Conjunctiva/sclera: Conjunctivae normal.     Pupils: Pupils are equal,  round, and reactive to light.  Cardiovascular:     Rate and Rhythm: Normal rate and regular rhythm.     Heart sounds: Normal heart sounds.  Pulmonary:     Effort: Pulmonary effort is normal. No respiratory distress.     Breath sounds: Normal breath sounds.  Abdominal:     General: There is no distension.     Palpations: Abdomen is soft.     Tenderness: There is no abdominal tenderness.  Musculoskeletal:        General: No deformity. Normal range of motion.     Cervical back: Normal range of motion and neck supple.  Skin:    General: Skin is warm and dry.     Comments: Cellulitis overlying the anterior left knee.  Minimal purulent drainage noted from just inferior to the left patella.  No appreciable abscess appropriate for I&D.  See images below.  Neurological:     General: No focal deficit present.     Mental Status: He is alert and oriented to person, place, and time.     ED Results / Procedures / Treatments   Labs (all labs ordered are listed, but only abnormal results are displayed) Labs Reviewed  CULTURE, BLOOD (ROUTINE X 2)  CULTURE, BLOOD (ROUTINE X 2)  CBC WITH DIFFERENTIAL/PLATELET  BASIC METABOLIC PANEL  LACTIC ACID, PLASMA  LACTIC ACID, PLASMA  EKG None  Radiology No results found.  Procedures Procedures    Medications Ordered in ED Medications  vancomycin (VANCOREADY) IVPB 1500 mg/300 mL (has no administration in time range)    ED Course/ Medical Decision Making/ A&P                             Medical Decision Making Amount and/or Complexity of Data Reviewed Labs: ordered. Radiology: ordered.  Risk Prescription drug management.    Medical Screen Complete  This patient presented to the ED with complaint of cellulitis.  This complaint involves an extensive number of treatment options. The initial differential diagnosis includes, but is not limited to, cellulitis, sepsis, metabolic abnormality, etc.  This presentation is: Acute,  Self-Limited, Previously Undiagnosed, Uncertain Prognosis, Complicated, Systemic Symptoms, and Threat to Life/Bodily Function  Patient is presenting with complaint of cellulitis to the anterior left knee.  Patient with history of IV drug abuse.  Patient denies any recent IV drug abuse.  Patient did take several doses of doxycycline that were "left over" over the last 2 days.  He feels that his infection has improved with doxycycline use.  No appreciable fluid collection appropriate for I&D found on exam.  Screening labs obtained are without significant abnormality.  Patient is reassured by workup.  Patient given 1 dose of vancomycin here.  Patient offered overall observation/admission.  Patient declined same.  Patient would prefer to go home with course of outpatient doxycycline.  Patient is aware of strict return precautions.  Importance of close follow-up is stressed.     Additional history obtained:  External records from outside sources obtained and reviewed including prior ED visits and prior Inpatient records.    Lab Tests:  I ordered and personally interpreted labs.  The pertinent results include: CBC, BMP, lactic acid, blood cultures x 2   Imaging Studies ordered:  I ordered imaging studies including plain films of left knee I independently visualized and interpreted obtained imaging which showed left tissue thickening anterior to the patellar tendon. I agree with the radiologist interpretation.   Cardiac Monitoring:  The patient was maintained on a cardiac monitor.  I personally viewed and interpreted the cardiac monitor which showed an underlying rhythm of: NSR   Medicines ordered:  I ordered medication including Vancomycin  for cellulitis   Reevaluation of the patient after these medicines showed that the patient: improved    Problem List / ED Course:  Left knee Cellulitis    Reevaluation:  After the interventions noted above, I reevaluated the  patient and found that they have: improved  Disposition:  After consideration of the diagnostic results and the patients response to treatment, I feel that the patent would benefit from close outpatient followup.          Final Clinical Impression(s) / ED Diagnoses Final diagnoses:  Cellulitis of left lower extremity    Rx / DC Orders ED Discharge Orders          Ordered    doxycycline (VIBRAMYCIN) 100 MG capsule  2 times daily        06/27/22 1528              Valarie Merino, MD 06/27/22 [phone]

## 2022-06-27 NOTE — ED Triage Notes (Signed)
Patient has left knee and left leg redness and swelling. Patient states he was seen in the ED and admitted for IV antibiotics. Patient left AMA. Patient states that he took an old prescription of doxycycline.  Patient denies taking any medications today.

## 2022-06-28 LAB — CULTURE, BLOOD (ROUTINE X 2)
Culture: NO GROWTH
Culture: NO GROWTH
Special Requests: ADEQUATE
Special Requests: ADEQUATE

## 2022-07-02 LAB — CULTURE, BLOOD (ROUTINE X 2)
Culture: NO GROWTH
Culture: NO GROWTH
Special Requests: ADEQUATE
Special Requests: ADEQUATE

## 2022-07-19 ENCOUNTER — Other Ambulatory Visit: Payer: Self-pay

## 2022-07-19 ENCOUNTER — Emergency Department (HOSPITAL_COMMUNITY): Payer: 59

## 2022-07-19 ENCOUNTER — Inpatient Hospital Stay (HOSPITAL_COMMUNITY)
Admission: EM | Admit: 2022-07-19 | Discharge: 2022-07-22 | DRG: 854 | Discharge status: Against Medical Advice | Payer: 59 | Attending: Internal Medicine | Admitting: Internal Medicine

## 2022-07-19 DIAGNOSIS — Z8249 Family history of ischemic heart disease and other diseases of the circulatory system: Secondary | ICD-10-CM

## 2022-07-19 DIAGNOSIS — E860 Dehydration: Secondary | ICD-10-CM | POA: Diagnosis present

## 2022-07-19 DIAGNOSIS — F1510 Other stimulant abuse, uncomplicated: Secondary | ICD-10-CM | POA: Diagnosis present

## 2022-07-19 DIAGNOSIS — Z833 Family history of diabetes mellitus: Secondary | ICD-10-CM

## 2022-07-19 DIAGNOSIS — F1910 Other psychoactive substance abuse, uncomplicated: Secondary | ICD-10-CM | POA: Diagnosis present

## 2022-07-19 DIAGNOSIS — Z5329 Procedure and treatment not carried out because of patient's decision for other reasons: Secondary | ICD-10-CM | POA: Diagnosis present

## 2022-07-19 DIAGNOSIS — F1410 Cocaine abuse, uncomplicated: Secondary | ICD-10-CM | POA: Diagnosis present

## 2022-07-19 DIAGNOSIS — F17210 Nicotine dependence, cigarettes, uncomplicated: Secondary | ICD-10-CM | POA: Diagnosis present

## 2022-07-19 DIAGNOSIS — M25469 Effusion, unspecified knee: Secondary | ICD-10-CM | POA: Diagnosis not present

## 2022-07-19 DIAGNOSIS — E871 Hypo-osmolality and hyponatremia: Secondary | ICD-10-CM | POA: Diagnosis present

## 2022-07-19 DIAGNOSIS — Z79899 Other long term (current) drug therapy: Secondary | ICD-10-CM

## 2022-07-19 DIAGNOSIS — L03116 Cellulitis of left lower limb: Secondary | ICD-10-CM | POA: Diagnosis present

## 2022-07-19 DIAGNOSIS — E872 Acidosis, unspecified: Secondary | ICD-10-CM | POA: Diagnosis present

## 2022-07-19 DIAGNOSIS — M71162 Other infective bursitis, left knee: Secondary | ICD-10-CM | POA: Diagnosis present

## 2022-07-19 DIAGNOSIS — R6520 Severe sepsis without septic shock: Secondary | ICD-10-CM | POA: Diagnosis present

## 2022-07-19 DIAGNOSIS — K219 Gastro-esophageal reflux disease without esophagitis: Secondary | ICD-10-CM | POA: Diagnosis present

## 2022-07-19 DIAGNOSIS — F1110 Opioid abuse, uncomplicated: Secondary | ICD-10-CM | POA: Diagnosis present

## 2022-07-19 DIAGNOSIS — A419 Sepsis, unspecified organism: Secondary | ICD-10-CM | POA: Diagnosis not present

## 2022-07-19 DIAGNOSIS — Z1152 Encounter for screening for COVID-19: Secondary | ICD-10-CM

## 2022-07-19 DIAGNOSIS — M009 Pyogenic arthritis, unspecified: Secondary | ICD-10-CM | POA: Diagnosis present

## 2022-07-19 HISTORY — DX: Attention-deficit hyperactivity disorder, unspecified type: F90.9

## 2022-07-19 LAB — CBC WITH DIFFERENTIAL/PLATELET
Abs Immature Granulocytes: 0.18 10*3/uL — ABNORMAL HIGH (ref 0.00–0.07)
Basophils Absolute: 0.1 10*3/uL (ref 0.0–0.1)
Basophils Relative: 0 %
Eosinophils Absolute: 0 10*3/uL (ref 0.0–0.5)
Eosinophils Relative: 0 %
HCT: 44.2 % (ref 39.0–52.0)
Hemoglobin: 14.3 g/dL (ref 13.0–17.0)
Immature Granulocytes: 1 %
Lymphocytes Relative: 4 %
Lymphs Abs: 0.8 10*3/uL (ref 0.7–4.0)
MCH: 28.6 pg (ref 26.0–34.0)
MCHC: 32.4 g/dL (ref 30.0–36.0)
MCV: 88.4 fL (ref 80.0–100.0)
Monocytes Absolute: 1.5 10*3/uL — ABNORMAL HIGH (ref 0.1–1.0)
Monocytes Relative: 7 %
Neutro Abs: 18.3 10*3/uL — ABNORMAL HIGH (ref 1.7–7.7)
Neutrophils Relative %: 88 %
Platelets: 180 10*3/uL (ref 150–400)
RBC: 5 MIL/uL (ref 4.22–5.81)
RDW: 13.9 % (ref 11.5–15.5)
WBC: 20.9 10*3/uL — ABNORMAL HIGH (ref 4.0–10.5)
nRBC: 0 % (ref 0.0–0.2)

## 2022-07-19 MED ORDER — SODIUM CHLORIDE 0.9 % IV SOLN
2.0000 g | Freq: Once | INTRAVENOUS | Status: AC
  Administered 2022-07-19: 2 g via INTRAVENOUS
  Filled 2022-07-19: qty 20

## 2022-07-19 MED ORDER — SODIUM CHLORIDE 0.9 % IV BOLUS (SEPSIS)
1000.0000 mL | Freq: Once | INTRAVENOUS | Status: AC
  Administered 2022-07-19: 1000 mL via INTRAVENOUS

## 2022-07-19 MED ORDER — SODIUM CHLORIDE 0.9 % IV BOLUS (SEPSIS)
500.0000 mL | Freq: Once | INTRAVENOUS | Status: AC
  Administered 2022-07-20: 500 mL via INTRAVENOUS

## 2022-07-19 MED ORDER — LACTATED RINGERS IV SOLN: INTRAVENOUS | Status: AC

## 2022-07-19 MED ORDER — VANCOMYCIN HCL IN DEXTROSE 1-5 GM/200ML-% IV SOLN
1000.0000 mg | Freq: Once | INTRAVENOUS | Status: AC
  Administered 2022-07-19: 1000 mg via INTRAVENOUS
  Filled 2022-07-19: qty 200

## 2022-07-19 NOTE — ED Notes (Signed)
Lab called needs a recollect on lactic acid d/t it being "too old" was drawn at 2243, pt refusing tech to draw for labs at this time, pt wanting ice chips, advised if he will let NT obtain labs then we can give a few ice chips, pt agrees.

## 2022-07-19 NOTE — ED Triage Notes (Signed)
Patient coming to ED for evaluation of swelling and redness to L knee.  Reports swelling and possible infection started three weeks ago.  Tried to "drain the infection by sticking a needle in it" three days ago and redness has gotten worse.  Was seen three weeks ago for same and was getting IV antibiotics.  C/o chills and body aches

## 2022-07-19 NOTE — ED Notes (Signed)
Pt has verbalized he is wanting to leave, does not want to stay or be admitted, only wants medication and to be sent home. This RN educated pt on importance to needing to stay as he appears very sick and septic, advised on negative outcome if he were to leave before treatment can be completed and he is well enough to go home. Pt st "it is not like that, I'll be fine", pt also st "it is against the law to be here without anyone" advised pt that friend left to go get food, that he will return shortly per the visitor. Pt agrees to stay at this time

## 2022-07-19 NOTE — Progress Notes (Signed)
A consult was received from an ED physician for Vancomycin per pharmacy dosing.  The patient's profile has been reviewed for ht/wt/allergies/indication/available labs.   A one time order has been placed for Vancomycin 1gm IV.  Further antibiotics/pharmacy consults should be ordered by admitting physician if indicated.                       Thank you, Netta Cedars PharmD 07/19/2022  11:35 PM

## 2022-07-20 ENCOUNTER — Inpatient Hospital Stay (HOSPITAL_COMMUNITY): Payer: 59 | Admitting: Certified Registered Nurse Anesthetist

## 2022-07-20 ENCOUNTER — Encounter (HOSPITAL_COMMUNITY): Payer: Self-pay | Admitting: Internal Medicine

## 2022-07-20 ENCOUNTER — Encounter (HOSPITAL_COMMUNITY)
Admission: EM | Discharge status: Against Medical Advice | Payer: Self-pay | Source: Home / Self Care | Attending: Internal Medicine

## 2022-07-20 ENCOUNTER — Other Ambulatory Visit: Payer: Self-pay

## 2022-07-20 DIAGNOSIS — A419 Sepsis, unspecified organism: Secondary | ICD-10-CM | POA: Diagnosis present

## 2022-07-20 DIAGNOSIS — E871 Hypo-osmolality and hyponatremia: Secondary | ICD-10-CM | POA: Diagnosis present

## 2022-07-20 DIAGNOSIS — E876 Hypokalemia: Secondary | ICD-10-CM | POA: Diagnosis not present

## 2022-07-20 DIAGNOSIS — F1910 Other psychoactive substance abuse, uncomplicated: Secondary | ICD-10-CM

## 2022-07-20 DIAGNOSIS — D759 Disease of blood and blood-forming organs, unspecified: Secondary | ICD-10-CM | POA: Diagnosis not present

## 2022-07-20 DIAGNOSIS — E860 Dehydration: Secondary | ICD-10-CM | POA: Diagnosis present

## 2022-07-20 DIAGNOSIS — F17210 Nicotine dependence, cigarettes, uncomplicated: Secondary | ICD-10-CM

## 2022-07-20 DIAGNOSIS — M009 Pyogenic arthritis, unspecified: Secondary | ICD-10-CM

## 2022-07-20 DIAGNOSIS — Z1152 Encounter for screening for COVID-19: Secondary | ICD-10-CM | POA: Diagnosis not present

## 2022-07-20 DIAGNOSIS — K219 Gastro-esophageal reflux disease without esophagitis: Secondary | ICD-10-CM | POA: Diagnosis present

## 2022-07-20 DIAGNOSIS — F1110 Opioid abuse, uncomplicated: Secondary | ICD-10-CM | POA: Diagnosis present

## 2022-07-20 DIAGNOSIS — R6520 Severe sepsis without septic shock: Secondary | ICD-10-CM

## 2022-07-20 DIAGNOSIS — D649 Anemia, unspecified: Secondary | ICD-10-CM | POA: Diagnosis not present

## 2022-07-20 DIAGNOSIS — F1510 Other stimulant abuse, uncomplicated: Secondary | ICD-10-CM | POA: Diagnosis present

## 2022-07-20 DIAGNOSIS — F1410 Cocaine abuse, uncomplicated: Secondary | ICD-10-CM | POA: Diagnosis present

## 2022-07-20 DIAGNOSIS — Z79899 Other long term (current) drug therapy: Secondary | ICD-10-CM | POA: Diagnosis not present

## 2022-07-20 DIAGNOSIS — M25562 Pain in left knee: Secondary | ICD-10-CM | POA: Diagnosis present

## 2022-07-20 DIAGNOSIS — Z8249 Family history of ischemic heart disease and other diseases of the circulatory system: Secondary | ICD-10-CM | POA: Diagnosis not present

## 2022-07-20 DIAGNOSIS — M7042 Prepatellar bursitis, left knee: Secondary | ICD-10-CM

## 2022-07-20 DIAGNOSIS — L03116 Cellulitis of left lower limb: Secondary | ICD-10-CM | POA: Diagnosis present

## 2022-07-20 DIAGNOSIS — M71162 Other infective bursitis, left knee: Secondary | ICD-10-CM | POA: Diagnosis not present

## 2022-07-20 DIAGNOSIS — M25469 Effusion, unspecified knee: Secondary | ICD-10-CM | POA: Diagnosis present

## 2022-07-20 DIAGNOSIS — Z5329 Procedure and treatment not carried out because of patient's decision for other reasons: Secondary | ICD-10-CM | POA: Diagnosis present

## 2022-07-20 DIAGNOSIS — Z833 Family history of diabetes mellitus: Secondary | ICD-10-CM | POA: Diagnosis not present

## 2022-07-20 DIAGNOSIS — B9689 Other specified bacterial agents as the cause of diseases classified elsewhere: Secondary | ICD-10-CM | POA: Diagnosis not present

## 2022-07-20 DIAGNOSIS — E872 Acidosis, unspecified: Secondary | ICD-10-CM | POA: Diagnosis present

## 2022-07-20 HISTORY — PX: KNEE ARTHROSCOPY: SHX127

## 2022-07-20 LAB — URINALYSIS, W/ REFLEX TO CULTURE (INFECTION SUSPECTED)
Bilirubin Urine: NEGATIVE
Glucose, UA: NEGATIVE mg/dL
Hgb urine dipstick: NEGATIVE
Ketones, ur: NEGATIVE mg/dL
Leukocytes,Ua: NEGATIVE
Nitrite: NEGATIVE
Protein, ur: NEGATIVE mg/dL
Specific Gravity, Urine: 1.011 (ref 1.005–1.030)
pH: 6 (ref 5.0–8.0)

## 2022-07-20 LAB — CBC
HCT: 38.1 % — ABNORMAL LOW (ref 39.0–52.0)
Hemoglobin: 12.3 g/dL — ABNORMAL LOW (ref 13.0–17.0)
MCH: 28.7 pg (ref 26.0–34.0)
MCHC: 32.3 g/dL (ref 30.0–36.0)
MCV: 88.8 fL (ref 80.0–100.0)
Platelets: 160 10*3/uL (ref 150–400)
RBC: 4.29 MIL/uL (ref 4.22–5.81)
RDW: 13.9 % (ref 11.5–15.5)
WBC: 20.3 10*3/uL — ABNORMAL HIGH (ref 4.0–10.5)
nRBC: 0 % (ref 0.0–0.2)

## 2022-07-20 LAB — RAPID URINE DRUG SCREEN, HOSP PERFORMED
Amphetamines: POSITIVE — AB
Barbiturates: NOT DETECTED
Benzodiazepines: POSITIVE — AB
Cocaine: POSITIVE — AB
Opiates: POSITIVE — AB
Tetrahydrocannabinol: NOT DETECTED

## 2022-07-20 LAB — BASIC METABOLIC PANEL
Anion gap: 9 (ref 5–15)
BUN: 15 mg/dL (ref 6–20)
CO2: 22 mmol/L (ref 22–32)
Calcium: 7.5 mg/dL — ABNORMAL LOW (ref 8.9–10.3)
Chloride: 99 mmol/L (ref 98–111)
Creatinine, Ser: 0.98 mg/dL (ref 0.61–1.24)
GFR, Estimated: >60 mL/min (ref >60)
Glucose, Bld: 132 mg/dL — ABNORMAL HIGH (ref 70–99)
Potassium: 3.8 mmol/L (ref 3.5–5.1)
Sodium: 130 mmol/L — ABNORMAL LOW (ref 135–145)

## 2022-07-20 LAB — RESP PANEL BY RT-PCR (RSV, FLU A&B, COVID)  RVPGX2
Influenza A by PCR: NEGATIVE
Influenza B by PCR: NEGATIVE
Resp Syncytial Virus by PCR: NEGATIVE
SARS Coronavirus 2 by RT PCR: NEGATIVE

## 2022-07-20 LAB — COMPREHENSIVE METABOLIC PANEL
ALT: 16 U/L (ref 0–44)
AST: 23 U/L (ref 15–41)
Albumin: 4 g/dL (ref 3.5–5.0)
Alkaline Phosphatase: 96 U/L (ref 38–126)
Anion gap: 11 (ref 5–15)
BUN: 19 mg/dL (ref 6–20)
CO2: 25 mmol/L (ref 22–32)
Calcium: 8.7 mg/dL — ABNORMAL LOW (ref 8.9–10.3)
Chloride: 97 mmol/L — ABNORMAL LOW (ref 98–111)
Creatinine, Ser: 1.06 mg/dL (ref 0.61–1.24)
GFR, Estimated: >60 mL/min (ref >60)
Glucose, Bld: 107 mg/dL — ABNORMAL HIGH (ref 70–99)
Potassium: 3.9 mmol/L (ref 3.5–5.1)
Sodium: 133 mmol/L — ABNORMAL LOW (ref 135–145)
Total Bilirubin: 0.9 mg/dL (ref 0.3–1.2)
Total Protein: 8.2 g/dL — ABNORMAL HIGH (ref 6.5–8.1)

## 2022-07-20 LAB — LACTIC ACID, PLASMA
Lactic Acid, Venous: 2 mmol/L (ref 0.5–1.9)
Lactic Acid, Venous: 2.4 mmol/L (ref 0.5–1.9)

## 2022-07-20 LAB — PROTIME-INR
INR: 1.1 (ref 0.8–1.2)
Prothrombin Time: 14.1 seconds (ref 11.4–15.2)

## 2022-07-20 LAB — APTT: aPTT: 31 seconds (ref 24–36)

## 2022-07-20 SURGERY — ARTHROSCOPY, KNEE
Anesthesia: General | Site: Knee | Laterality: Left

## 2022-07-20 MED ORDER — MIDAZOLAM HCL 5 MG/5ML IJ SOLN
INTRAMUSCULAR | Status: DC | PRN
  Administered 2022-07-20: 2 mg via INTRAVENOUS

## 2022-07-20 MED ORDER — LORAZEPAM 2 MG/ML IJ SOLN
1.0000 mg | Freq: Once | INTRAMUSCULAR | Status: AC
  Administered 2022-07-20: 1 mg via INTRAVENOUS
  Filled 2022-07-20: qty 1

## 2022-07-20 MED ORDER — DEXAMETHASONE SODIUM PHOSPHATE 10 MG/ML IJ SOLN
INTRAMUSCULAR | Status: AC
  Filled 2022-07-20: qty 1

## 2022-07-20 MED ORDER — PHENYLEPHRINE 80 MCG/ML (10ML) SYRINGE FOR IV PUSH (FOR BLOOD PRESSURE SUPPORT)
PREFILLED_SYRINGE | INTRAVENOUS | Status: DC | PRN
  Administered 2022-07-20 (×2): 80 ug via INTRAVENOUS

## 2022-07-20 MED ORDER — METOCLOPRAMIDE HCL 5 MG/ML IJ SOLN: 5.0000 mg | Freq: Three times a day (TID) | INTRAMUSCULAR | Status: DC | PRN

## 2022-07-20 MED ORDER — ACETAMINOPHEN 325 MG PO TABS
650.0000 mg | ORAL_TABLET | Freq: Four times a day (QID) | ORAL | Status: DC | PRN
  Administered 2022-07-20: 650 mg via ORAL
  Filled 2022-07-20: qty 2

## 2022-07-20 MED ORDER — FENTANYL CITRATE (PF) 100 MCG/2ML IJ SOLN
INTRAMUSCULAR | Status: AC
  Filled 2022-07-20: qty 2

## 2022-07-20 MED ORDER — HYDROMORPHONE HCL 1 MG/ML IJ SOLN
1.0000 mg | Freq: Once | INTRAMUSCULAR | Status: AC
  Administered 2022-07-20: 1 mg via INTRAVENOUS
  Filled 2022-07-20: qty 1

## 2022-07-20 MED ORDER — DICYCLOMINE HCL 20 MG PO TABS
20.0000 mg | ORAL_TABLET | Freq: Four times a day (QID) | ORAL | Status: DC | PRN
  Administered 2022-07-20: 20 mg via ORAL
  Filled 2022-07-20 (×2): qty 1

## 2022-07-20 MED ORDER — ROCURONIUM BROMIDE 10 MG/ML (PF) SYRINGE
PREFILLED_SYRINGE | INTRAVENOUS | Status: AC
  Filled 2022-07-20: qty 10

## 2022-07-20 MED ORDER — HYDROMORPHONE HCL 1 MG/ML IJ SOLN: 1.0000 mg | Freq: Once | INTRAMUSCULAR | Status: DC

## 2022-07-20 MED ORDER — METHOCARBAMOL 500 MG PO TABS
500.0000 mg | ORAL_TABLET | Freq: Three times a day (TID) | ORAL | Status: DC | PRN
  Filled 2022-07-20: qty 1

## 2022-07-20 MED ORDER — KETAMINE HCL 50 MG/5ML IJ SOSY
PREFILLED_SYRINGE | INTRAMUSCULAR | Status: AC
  Filled 2022-07-20: qty 5

## 2022-07-20 MED ORDER — HYDROMORPHONE HCL 1 MG/ML IJ SOLN
1.0000 mg | INTRAMUSCULAR | Status: DC | PRN
  Administered 2022-07-20: 1 mg via INTRAVENOUS
  Filled 2022-07-20: qty 1

## 2022-07-20 MED ORDER — ACETAMINOPHEN 10 MG/ML IV SOLN
INTRAVENOUS | Status: AC
  Filled 2022-07-20: qty 100

## 2022-07-20 MED ORDER — KETAMINE HCL 10 MG/ML IJ SOLN: INTRAMUSCULAR | Status: DC | PRN

## 2022-07-20 MED ORDER — 0.9 % SODIUM CHLORIDE (POUR BTL) OPTIME
TOPICAL | Status: DC | PRN
  Administered 2022-07-20: 1000 mL

## 2022-07-20 MED ORDER — ACETAMINOPHEN 650 MG RE SUPP: 650.0000 mg | Freq: Four times a day (QID) | RECTAL | Status: DC | PRN

## 2022-07-20 MED ORDER — LACTATED RINGERS IV SOLN: INTRAVENOUS | Status: DC

## 2022-07-20 MED ORDER — KETAMINE HCL 10 MG/ML IJ SOLN
INTRAMUSCULAR | Status: DC | PRN
  Administered 2022-07-20: 50 mg via INTRAVENOUS

## 2022-07-20 MED ORDER — DOCUSATE SODIUM 100 MG PO CAPS
100.0000 mg | ORAL_CAPSULE | Freq: Two times a day (BID) | ORAL | Status: DC
  Administered 2022-07-20 – 2022-07-21 (×2): 100 mg via ORAL
  Filled 2022-07-20 (×3): qty 1

## 2022-07-20 MED ORDER — ACETAMINOPHEN 325 MG PO TABS: 325.0000 mg | ORAL_TABLET | Freq: Four times a day (QID) | ORAL | Status: DC | PRN

## 2022-07-20 MED ORDER — LOPERAMIDE HCL 2 MG PO CAPS: 2.0000 mg | ORAL_CAPSULE | ORAL | Status: DC | PRN

## 2022-07-20 MED ORDER — HYDROXYZINE HCL 25 MG PO TABS
25.0000 mg | ORAL_TABLET | Freq: Four times a day (QID) | ORAL | Status: DC | PRN
  Administered 2022-07-20 – 2022-07-21 (×3): 25 mg via ORAL
  Filled 2022-07-20 (×3): qty 1

## 2022-07-20 MED ORDER — PHENYLEPHRINE HCL-NACL 20-0.9 MG/250ML-% IV SOLN
INTRAVENOUS | Status: AC
  Filled 2022-07-20: qty 250

## 2022-07-20 MED ORDER — ONDANSETRON HCL 4 MG/2ML IJ SOLN
INTRAMUSCULAR | Status: AC
  Filled 2022-07-20: qty 2

## 2022-07-20 MED ORDER — ONDANSETRON HCL 4 MG PO TABS: 4.0000 mg | ORAL_TABLET | Freq: Four times a day (QID) | ORAL | Status: DC | PRN

## 2022-07-20 MED ORDER — SODIUM CHLORIDE 0.9 % IV SOLN: 2.0000 g | INTRAVENOUS | Status: DC

## 2022-07-20 MED ORDER — SODIUM CHLORIDE 0.9 % IR SOLN
Status: DC | PRN
  Administered 2022-07-20: 3000 mL

## 2022-07-20 MED ORDER — ONDANSETRON HCL 4 MG/2ML IJ SOLN
4.0000 mg | Freq: Once | INTRAMUSCULAR | Status: AC
  Administered 2022-07-20: 4 mg via INTRAVENOUS
  Filled 2022-07-20: qty 2

## 2022-07-20 MED ORDER — FENTANYL CITRATE (PF) 100 MCG/2ML IJ SOLN
INTRAMUSCULAR | Status: DC | PRN
  Administered 2022-07-20 (×6): 50 ug via INTRAVENOUS

## 2022-07-20 MED ORDER — SUCCINYLCHOLINE CHLORIDE 200 MG/10ML IV SOSY
PREFILLED_SYRINGE | INTRAVENOUS | Status: AC
  Filled 2022-07-20: qty 10

## 2022-07-20 MED ORDER — SUCCINYLCHOLINE CHLORIDE 200 MG/10ML IV SOSY
PREFILLED_SYRINGE | INTRAVENOUS | Status: DC | PRN
  Administered 2022-07-20: 160 mg via INTRAVENOUS

## 2022-07-20 MED ORDER — PHENYLEPHRINE 80 MCG/ML (10ML) SYRINGE FOR IV PUSH (FOR BLOOD PRESSURE SUPPORT)
PREFILLED_SYRINGE | INTRAVENOUS | Status: AC
  Filled 2022-07-20: qty 10

## 2022-07-20 MED ORDER — ORAL CARE MOUTH RINSE: 15.0000 mL | Freq: Once | OROMUCOSAL | Status: AC

## 2022-07-20 MED ORDER — ROCURONIUM BROMIDE 10 MG/ML (PF) SYRINGE
PREFILLED_SYRINGE | INTRAVENOUS | Status: DC | PRN
  Administered 2022-07-20: 40 mg via INTRAVENOUS

## 2022-07-20 MED ORDER — PROPOFOL 10 MG/ML IV BOLUS
INTRAVENOUS | Status: AC
  Filled 2022-07-20: qty 20

## 2022-07-20 MED ORDER — TRANEXAMIC ACID-NACL 1000-0.7 MG/100ML-% IV SOLN
INTRAVENOUS | Status: DC | PRN
  Administered 2022-07-20: 1000 mg via INTRAVENOUS

## 2022-07-20 MED ORDER — TRANEXAMIC ACID-NACL 1000-0.7 MG/100ML-% IV SOLN
INTRAVENOUS | Status: AC
  Filled 2022-07-20: qty 100

## 2022-07-20 MED ORDER — METHOCARBAMOL 1000 MG/10ML IJ SOLN: 500.0000 mg | Freq: Four times a day (QID) | INTRAVENOUS | Status: DC | PRN

## 2022-07-20 MED ORDER — ACETAMINOPHEN 500 MG PO TABS
1000.0000 mg | ORAL_TABLET | Freq: Once | ORAL | Status: AC
  Administered 2022-07-20: 1000 mg via ORAL
  Filled 2022-07-20: qty 2

## 2022-07-20 MED ORDER — ONDANSETRON HCL 4 MG/2ML IJ SOLN
INTRAMUSCULAR | Status: DC | PRN
  Administered 2022-07-20: 4 mg via INTRAVENOUS

## 2022-07-20 MED ORDER — METHOCARBAMOL 500 MG PO TABS
500.0000 mg | ORAL_TABLET | Freq: Four times a day (QID) | ORAL | Status: DC | PRN
  Administered 2022-07-21 (×2): 500 mg via ORAL
  Filled 2022-07-20: qty 1

## 2022-07-20 MED ORDER — ACETAMINOPHEN 10 MG/ML IV SOLN
INTRAVENOUS | Status: DC | PRN
  Administered 2022-07-20: 1000 mg via INTRAVENOUS

## 2022-07-20 MED ORDER — CHLORHEXIDINE GLUCONATE 0.12 % MT SOLN
15.0000 mL | Freq: Once | OROMUCOSAL | Status: AC
  Administered 2022-07-20: 15 mL via OROMUCOSAL

## 2022-07-20 MED ORDER — SODIUM CHLORIDE 0.9 % IV SOLN: INTRAVENOUS | Status: AC

## 2022-07-20 MED ORDER — PROPOFOL 10 MG/ML IV BOLUS
INTRAVENOUS | Status: DC | PRN
  Administered 2022-07-20: 160 mg via INTRAVENOUS

## 2022-07-20 MED ORDER — OXYCODONE HCL 5 MG PO TABS
5.0000 mg | ORAL_TABLET | ORAL | Status: DC | PRN
  Administered 2022-07-20 – 2022-07-21 (×5): 10 mg via ORAL
  Filled 2022-07-20 (×6): qty 2

## 2022-07-20 MED ORDER — DEXAMETHASONE SODIUM PHOSPHATE 10 MG/ML IJ SOLN
INTRAMUSCULAR | Status: DC | PRN
  Administered 2022-07-20: 8 mg via INTRAVENOUS

## 2022-07-20 MED ORDER — SUGAMMADEX SODIUM 200 MG/2ML IV SOLN
INTRAVENOUS | Status: DC | PRN
  Administered 2022-07-20: 200 mg via INTRAVENOUS

## 2022-07-20 MED ORDER — HYDROMORPHONE HCL 1 MG/ML IJ SOLN: 0.5000 mg | INTRAMUSCULAR | Status: DC | PRN

## 2022-07-20 MED ORDER — VANCOMYCIN HCL IN DEXTROSE 1-5 GM/200ML-% IV SOLN
1000.0000 mg | Freq: Two times a day (BID) | INTRAVENOUS | Status: DC
  Administered 2022-07-20 – 2022-07-21 (×3): 1000 mg via INTRAVENOUS
  Filled 2022-07-20 (×3): qty 200

## 2022-07-20 MED ORDER — CEFAZOLIN SODIUM-DEXTROSE 1-4 GM/50ML-% IV SOLN: 1.0000 g | Freq: Three times a day (TID) | INTRAVENOUS | Status: DC

## 2022-07-20 MED ORDER — LIDOCAINE 2% (20 MG/ML) 5 ML SYRINGE
INTRAMUSCULAR | Status: DC | PRN
  Administered 2022-07-20: 60 mg via INTRAVENOUS

## 2022-07-20 MED ORDER — HYDROMORPHONE HCL 1 MG/ML IJ SOLN
2.0000 mg | INTRAMUSCULAR | Status: DC | PRN
  Administered 2022-07-20 – 2022-07-21 (×5): 2 mg via INTRAVENOUS
  Filled 2022-07-20 (×5): qty 2

## 2022-07-20 MED ORDER — ACETAMINOPHEN 500 MG PO TABS
1000.0000 mg | ORAL_TABLET | Freq: Four times a day (QID) | ORAL | Status: AC
  Administered 2022-07-21 (×4): 1000 mg via ORAL
  Filled 2022-07-20 (×4): qty 2

## 2022-07-20 MED ORDER — ONDANSETRON 4 MG PO TBDP: 4.0000 mg | ORAL_TABLET | Freq: Four times a day (QID) | ORAL | Status: DC | PRN

## 2022-07-20 MED ORDER — CEFAZOLIN SODIUM-DEXTROSE 2-4 GM/100ML-% IV SOLN
2.0000 g | Freq: Three times a day (TID) | INTRAVENOUS | Status: DC
  Administered 2022-07-20 – 2022-07-21 (×5): 2 g via INTRAVENOUS
  Filled 2022-07-20 (×6): qty 100

## 2022-07-20 MED ORDER — LIDOCAINE HCL (PF) 2 % IJ SOLN
INTRAMUSCULAR | Status: AC
  Filled 2022-07-20: qty 5

## 2022-07-20 MED ORDER — MIDAZOLAM HCL 2 MG/2ML IJ SOLN
INTRAMUSCULAR | Status: AC
  Filled 2022-07-20: qty 2

## 2022-07-20 MED ORDER — ONDANSETRON HCL 4 MG/2ML IJ SOLN: 4.0000 mg | Freq: Four times a day (QID) | INTRAMUSCULAR | Status: DC | PRN

## 2022-07-20 MED ORDER — METOCLOPRAMIDE HCL 5 MG PO TABS: 5.0000 mg | ORAL_TABLET | Freq: Three times a day (TID) | ORAL | Status: DC | PRN

## 2022-07-20 SURGICAL SUPPLY — 42 items
BAG COUNTER SPONGE SURGICOUNT (BAG) IMPLANT
BAG SPNG CNTER NS LX DISP (BAG)
BLADE CLIPPER SURG (BLADE) ×1 IMPLANT
BNDG CMPR 6"X 5 YARDS HK CLSR (GAUZE/BANDAGES/DRESSINGS) ×1
BNDG CMPR MED 10X6 ELC LF (GAUZE/BANDAGES/DRESSINGS) ×1
BNDG ELASTIC 6INX 5YD STR LF (GAUZE/BANDAGES/DRESSINGS) ×1 IMPLANT
BNDG ELASTIC 6X10 VLCR STRL LF (GAUZE/BANDAGES/DRESSINGS) IMPLANT
CUFF TOURN SGL QUICK 34 (TOURNIQUET CUFF) ×1
CUFF TRNQT CYL 34X4.125X (TOURNIQUET CUFF) ×1 IMPLANT
DRAPE INCISE IOBAN 66X45 STRL (DRAPES) ×1 IMPLANT
DRAPE U-SHAPE 47X51 STRL (DRAPES) ×1 IMPLANT
DRSG TEGADERM 4X4.75 (GAUZE/BANDAGES/DRESSINGS) ×3 IMPLANT
DURAPREP 26ML APPLICATOR (WOUND CARE) ×1 IMPLANT
EVACUATOR 1/8 PVC DRAIN (DRAIN) IMPLANT
GAUZE 4X4 16PLY ~~LOC~~+RFID DBL (SPONGE) ×1 IMPLANT
GAUZE PAD ABD 7.5X8 STRL (GAUZE/BANDAGES/DRESSINGS) IMPLANT
GAUZE PAD ABD 8X10 STRL (GAUZE/BANDAGES/DRESSINGS) ×1 IMPLANT
GAUZE SPONGE 4X4 12PLY STRL (GAUZE/BANDAGES/DRESSINGS) ×1 IMPLANT
GAUZE XEROFORM 1X8 LF (GAUZE/BANDAGES/DRESSINGS) IMPLANT
GLOVE BIO SURGEON STRL SZ 6.5 (GLOVE) ×1 IMPLANT
GLOVE BIOGEL PI IND STRL 7.0 (GLOVE) ×1 IMPLANT
GLOVE BIOGEL PI IND STRL 8 (GLOVE) ×1 IMPLANT
GLOVE SURG ORTHO 8.0 STRL STRW (GLOVE) ×1 IMPLANT
GOWN STRL REUS W/ TWL LRG LVL3 (GOWN DISPOSABLE) ×2 IMPLANT
GOWN STRL REUS W/TWL LRG LVL3 (GOWN DISPOSABLE) ×2
HANDPIECE INTERPULSE COAX TIP (DISPOSABLE) ×1
IMMOBILIZER KNEE 20 (SOFTGOODS) ×1 IMPLANT
IMMOBILIZER KNEE 20 THIGH 36 (SOFTGOODS) IMPLANT
KIT BASIN OR (CUSTOM PROCEDURE TRAY) IMPLANT
PACK ARTHROSCOPY WL (CUSTOM PROCEDURE TRAY) ×1 IMPLANT
PADDING CAST ABS COTTON 6X4 NS (CAST SUPPLIES) IMPLANT
PADDING CAST COTTON 6X4 STRL (CAST SUPPLIES) ×1 IMPLANT
PENCIL SMOKE EVACUATOR (MISCELLANEOUS) IMPLANT
PROTECTOR NERVE ULNAR (MISCELLANEOUS) ×1 IMPLANT
SET HNDPC FAN SPRY TIP SCT (DISPOSABLE) IMPLANT
SPONGE T-LAP 4X18 ~~LOC~~+RFID (SPONGE) ×1 IMPLANT
STAPLER VISISTAT 35W (STAPLE) ×1 IMPLANT
SUT ETHILON 3 0 PS 1 (SUTURE) ×2 IMPLANT
SUT VIC AB 2-0 CT1 27 (SUTURE) ×1
SUT VIC AB 2-0 CT1 TAPERPNT 27 (SUTURE) ×1 IMPLANT
TOWEL OR 17X26 10 PK STRL BLUE (TOWEL DISPOSABLE) ×3 IMPLANT
TUBING ARTHROSCOPY IRRIG 16FT (MISCELLANEOUS) ×1 IMPLANT

## 2022-07-20 NOTE — ED Notes (Signed)
Pt appears calmer and at rest, laying comfortable in bed, blanket on pt for warmth, this RN shut off lights to room to help induce rest, door remains open for observation, friend remains at bedside, side rails up x2, call bell within reach, advised friend if he needs anything don't hesitate to ask.

## 2022-07-20 NOTE — ED Notes (Signed)
Patient refused EKG.  Stated "he was leaving "

## 2022-07-20 NOTE — Progress Notes (Signed)
Pt returned from PACU sleeping, responds to voices. Left knee immobilizer in place, dressing assessed underneath was CDI upon arrival to flow. Hemovac drain compressed for suction with no drainage. Mother, Dalene Seltzer, at bedside. Oral care performed. Pt began crying in pain requesting medication. Pain interventions provided. Will pass on in end of shift report and will continue to monitor.

## 2022-07-20 NOTE — Consult Note (Signed)
Reason for Consult: Left knee pain Referring Physician: Dr. Darylene Price is an 47 y.o. male.  HPI: Johnathan Brown is a 47 year old patient with history of IV drug abuse.  Has had left knee pain on and off for 2 months.  Was seen at [facility].  Has had antibiotic courses on and off as well.  Currently admitted for worsening left knee pain.  Patient has been on IV antibiotics since he has been here.  Does have leukocytosis.  Presents now for operative evaluation for left knee infected prepatellar bursitis  Past Medical History:  Diagnosis Date   Abscess 01/22/2015   ADHD    GERD (gastroesophageal reflux disease)     Past Surgical History:  Procedure Laterality Date   I & D EXTREMITY Right 01/25/2015   Procedure: MINOR IRRIGATION AND DEBRIDEMENT RIGHT ARM;  Surgeon: Newt Minion, MD;  Location: [facility];  Service: Orthopedics;  Laterality: Right;   INNER EAR SURGERY     x 5     Family History  Problem Relation Age of Onset   Hypertension Father    Diabetes Father    Crohn's disease Father     Social History:  reports that he has been smoking cigarettes. He has a 7.50 pack-year smoking history. He has never used smokeless tobacco. He reports that he does not currently use drugs. He reports that he does not drink alcohol.  Allergies: No Known Allergies  Medications: I have reviewed the patient's current medications.  Results for orders placed or performed during the hospital encounter of 07/19/22 (from the past 48 hour(s))  Comprehensive metabolic panel     Status: Abnormal   Collection Time: 07/19/22 10:43 PM  Result Value Ref Range   Sodium 133 (L) 135 - 145 mmol/L   Potassium 3.9 3.5 - 5.1 mmol/L   Chloride 97 (L) 98 - 111 mmol/L   CO2 25 22 - 32 mmol/L   Glucose, Bld 107 (H) 70 - 99 mg/dL    Comment: Glucose reference range applies only to samples taken after fasting for at least 8 hours.   BUN 19 6 - 20 mg/dL   Creatinine, Ser 1.06 0.61 - 1.24 mg/dL   Calcium  8.7 (L) 8.9 - 10.3 mg/dL   Total Protein 8.2 (H) 6.5 - 8.1 g/dL   Albumin 4.0 3.5 - 5.0 g/dL   AST 23 15 - 41 U/L   ALT 16 0 - 44 U/L   Alkaline Phosphatase 96 38 - 126 U/L   Total Bilirubin 0.9 0.3 - 1.2 mg/dL   GFR, Estimated >60 >60 mL/min    Comment: (NOTE) Calculated using the CKD-EPI Creatinine Equation (2021)    Anion gap 11 5 - 15    Comment: Performed at [facility], [place] [address] [place], [place] [address]  CBC with Differential     Status: Abnormal   Collection Time: 07/19/22 10:43 PM  Result Value Ref Range   WBC 20.9 (H) 4.0 - 10.5 K/uL   RBC 5.00 4.22 - 5.81 MIL/uL   Hemoglobin 14.3 13.0 - 17.0 g/dL   HCT 44.2 39.0 - 52.0 %   MCV 88.4 80.0 - 100.0 fL   MCH 28.6 26.0 - 34.0 pg   MCHC 32.4 30.0 - 36.0 g/dL   RDW 13.9 11.5 - 15.5 %   Platelets 180 150 - 400 K/uL   nRBC 0.0 0.0 - 0.2 %   Neutrophils Relative % 88 %   Neutro Abs 18.3 (H)  1.7 - 7.7 K/uL   Lymphocytes Relative 4 %   Lymphs Abs 0.8 0.7 - 4.0 K/uL   Monocytes Relative 7 %   Monocytes Absolute 1.5 (H) 0.1 - 1.0 K/uL   Eosinophils Relative 0 %   Eosinophils Absolute 0.0 0.0 - 0.5 K/uL   Basophils Relative 0 %   Basophils Absolute 0.1 0.0 - 0.1 K/uL   Immature Granulocytes 1 %   Abs Immature Granulocytes 0.18 (H) 0.00 - 0.07 K/uL    Comment: Performed at [facility], [place] [address] [place], [place] [address]  Protime-INR     Status: None   Collection Time: 07/19/22 10:43 PM  Result Value Ref Range   Prothrombin Time 14.1 11.4 - 15.2 seconds   INR 1.1 0.8 - 1.2    Comment: (NOTE) INR goal varies based on device and disease states. Performed at [facility], [place] [address], [place], [place] [address]   APTT     Status: None   Collection Time: 07/19/22 10:43 PM  Result Value Ref Range   aPTT 31 24 - 36 seconds    Comment: Performed at [facility], [place] [address] [place], [place] [address]  Lactic acid, plasma      Status: Abnormal   Collection Time: 07/19/22 11:58 PM  Result Value Ref Range   Lactic Acid, Venous 2.4 (HH) 0.5 - 1.9 mmol/L    Comment: CRITICAL RESULT CALLED TO, READ BACK BY AND VERIFIED WITH I. CORTES, RN 07/20/22 0026 J. COLE Performed at [facility], [place] [address] [place], [place] [address]   Resp panel by RT-PCR (RSV, Flu A&B, Covid) Anterior Nasal Swab     Status: None   Collection Time: 07/20/22  1:06 AM   Specimen: Anterior Nasal Swab  Result Value Ref Range   SARS Coronavirus 2 by RT PCR NEGATIVE NEGATIVE    Comment: (NOTE) SARS-CoV-2 target nucleic acids are NOT DETECTED.  The SARS-CoV-2 RNA is generally detectable in upper respiratory specimens during the acute phase of infection. The lowest concentration of SARS-CoV-2 viral copies this assay can detect is 138 copies/mL. A negative result does not preclude SARS-Cov-2 infection and should not be used as the sole basis for treatment or other patient management decisions. A negative result may occur with  improper specimen collection/handling, submission of specimen other than nasopharyngeal swab, presence of viral mutation(s) within the areas targeted by this assay, and inadequate number of viral copies(<138 copies/mL). A negative result must be combined with clinical observations, patient history, and epidemiological information. The expected result is Negative.  Fact Sheet for Patients:  EntrepreneurPulse.com.au  Fact Sheet for Healthcare Providers:  IncredibleEmployment.be  This test is no t yet approved or cleared by the [place] FDA and  has been authorized for detection and/or diagnosis of SARS-CoV-2 by FDA under an Emergency Use Authorization (EUA). This EUA will remain  in effect (meaning this test can be used) for the duration of the COVID-19 declaration under Section 564(b)(1) of the Act, 21 U.S.C.section 360bbb-3(b)(1), unless the authorization  is terminated  or revoked sooner.       Influenza A by PCR NEGATIVE NEGATIVE   Influenza B by PCR NEGATIVE NEGATIVE    Comment: (NOTE) The Xpert Xpress SARS-CoV-2/FLU/RSV plus assay is intended as an aid in the diagnosis of influenza from Nasopharyngeal swab specimens and should not be used as a sole basis for treatment. Nasal washings and aspirates are unacceptable for Xpert Xpress SARS-CoV-2/FLU/RSV testing.  Fact Sheet for Patients: EntrepreneurPulse.com.au  Fact Sheet for Healthcare Providers: IncredibleEmployment.be  This test is not yet approved or cleared by the [place] FDA and has been authorized for detection and/or diagnosis of SARS-CoV-2 by FDA under an Emergency Use Authorization (EUA). This EUA will remain in effect (meaning this test can be used) for the duration of the COVID-19 declaration under Section 564(b)(1) of the Act, 21 U.S.C. section 360bbb-3(b)(1), unless the authorization is terminated or revoked.     Resp Syncytial Virus by PCR NEGATIVE NEGATIVE    Comment: (NOTE) Fact Sheet for Patients: EntrepreneurPulse.com.au  Fact Sheet for Healthcare Providers: IncredibleEmployment.be  This test is not yet approved or cleared by the [place] FDA and has been authorized for detection and/or diagnosis of SARS-CoV-2 by FDA under an Emergency Use Authorization (EUA). This EUA will remain in effect (meaning this test can be used) for the duration of the COVID-19 declaration under Section 564(b)(1) of the Act, 21 U.S.C. section 360bbb-3(b)(1), unless the authorization is terminated or revoked.  Performed at [facility], [place] [address] [place], [place] [address]   Lactic acid, plasma     Status: Abnormal   Collection Time: 07/20/22  1:32 AM  Result Value Ref Range   Lactic Acid, Venous 2.0 (HH) 0.5 - 1.9 mmol/L    Comment: CRITICAL VALUE NOTED. VALUE IS  CONSISTENT WITH PREVIOUSLY REPORTED/CALLED VALUE Performed at [place] [address], [place], [place] [address]   CBC     Status: Abnormal   Collection Time: 07/20/22  5:25 AM  Result Value Ref Range   WBC 20.3 (H) 4.0 - 10.5 K/uL   RBC 4.29 4.22 - 5.81 MIL/uL   Hemoglobin 12.3 (L) 13.0 - 17.0 g/dL   HCT 38.1 (L) 39.0 - 52.0 %   MCV 88.8 80.0 - 100.0 fL   MCH 28.7 26.0 - 34.0 pg   MCHC 32.3 30.0 - 36.0 g/dL   RDW 13.9 11.5 - 15.5 %   Platelets 160 150 - 400 K/uL   nRBC 0.0 0.0 - 0.2 %    Comment: Performed at [facility], [place] [address] [place], [place] [id number]  Basic metabolic panel     Status: Abnormal   Collection Time: 07/20/22  5:25 AM  Result Value Ref Range   Sodium 130 (L) 135 - 145 mmol/L   Potassium 3.8 3.5 - 5.1 mmol/L   Chloride 99 98 - 111 mmol/L   CO2 22 22 - 32 mmol/L   Glucose, Bld 132 (H) 70 - 99 mg/dL    Comment: Glucose reference range applies only to samples taken after fasting for at least 8 hours.   BUN 15 6 - 20 mg/dL   Creatinine, Ser 0.98 0.61 - 1.24 mg/dL   Calcium 7.5 (L) 8.9 - 10.3 mg/dL   GFR, Estimated >60 >60 mL/min    Comment: (NOTE) Calculated using the CKD-EPI Creatinine Equation (2021)    Anion gap 9 5 - 15    Comment: Performed at [facility], [place] [address], [place], [place] [address]  Urinalysis, w/ Reflex to Culture (Infection Suspected) -Urine, Clean Catch     Status: Abnormal   Collection Time: 07/20/22  8:13 AM  Result Value Ref Range   Specimen Source URINE, CLEAN CATCH    Color, Urine YELLOW YELLOW   APPearance CLEAR CLEAR   Specific Gravity, Urine 1.011 1.005 - 1.030   pH 6.0 5.0 - 8.0   Glucose, UA  NEGATIVE NEGATIVE mg/dL   Hgb urine dipstick NEGATIVE NEGATIVE   Bilirubin Urine NEGATIVE NEGATIVE   Ketones, ur NEGATIVE NEGATIVE mg/dL   Protein, ur NEGATIVE NEGATIVE mg/dL   Nitrite NEGATIVE NEGATIVE   Leukocytes,Ua NEGATIVE NEGATIVE   RBC / HPF 0-5 0 - 5  RBC/hpf   WBC, UA 0-5 0 - 5 WBC/hpf    Comment:        Reflex urine culture not performed if WBC <=10, OR if Squamous epithelial cells >5. If Squamous epithelial cells >5 suggest recollection.    Bacteria, UA RARE (A) NONE SEEN   Squamous Epithelial / HPF 0-5 0 - 5 /HPF   Mucus PRESENT     Comment: Performed at [facility], [place] [address] [place], [place] [address]  Rapid urine drug screen (hospital performed)     Status: Abnormal   Collection Time: 07/20/22  8:13 AM  Result Value Ref Range   Opiates POSITIVE (A) NONE DETECTED   Cocaine POSITIVE (A) NONE DETECTED   Benzodiazepines POSITIVE (A) NONE DETECTED   Amphetamines POSITIVE (A) NONE DETECTED   Tetrahydrocannabinol NONE DETECTED NONE DETECTED   Barbiturates NONE DETECTED NONE DETECTED    Comment: (NOTE) DRUG SCREEN FOR MEDICAL PURPOSES ONLY.  IF CONFIRMATION IS NEEDED FOR ANY PURPOSE, NOTIFY LAB WITHIN 5 DAYS.  LOWEST DETECTABLE LIMITS FOR URINE DRUG SCREEN Drug Class                     Cutoff (ng/mL) Amphetamine and metabolites    1000 Barbiturate and metabolites    200 Benzodiazepine                 200 Opiates and metabolites        300 Cocaine and metabolites        300 THC                            50 Performed at [facility], [place] [address] [place], [place] [address]     DG Knee Complete 4 Views Left  Result Date: 07/19/2022 CLINICAL DATA:  Septic joint.  Left knee pain and infection EXAM: LEFT KNEE - COMPLETE 4+ VIEW COMPARISON:  06/27/2022 FINDINGS: No evidence of acute fracture or dislocation of the left knee. No focal bone lesion or bone destruction. No cortical erosion or sclerosis to suggest evidence of osteomyelitis. Joint spaces are normal. No significant effusion. Subcutaneous soft tissue swelling anterior to the patella and patellar ligament similar to prior study. No soft tissue gas or radiopaque foreign body demonstrated. IMPRESSION: 1. Soft tissue swelling  anterior to the patella and patellar ligament. No significant effusion. 2. No acute bony abnormalities. Electronically Signed   By: Lucienne Capers M.D.   On: 07/19/2022 23:50    Review of Systems  Musculoskeletal:  Positive for arthralgias.  All other systems reviewed and are negative.  Blood pressure 103/72, pulse (!) 109, temperature (!) 101.2 F (38.4 C), temperature source Oral, resp. rate 20, height 5\' 10"  (1.778 m), weight 76.7 kg, SpO2 95 %. Physical Exam Vitals reviewed.  HENT:     Head: Normocephalic.     Nose: Nose normal.     Mouth/Throat:     Mouth: Mucous membranes are moist.  Cardiovascular:     Rate and Rhythm: Normal rate.     Pulses: Normal pulses.  Pulmonary:     Effort: Pulmonary effort is normal.  Abdominal:  General: Abdomen is flat.  Musculoskeletal:     Cervical back: Normal range of motion.  Skin:    General: Skin is warm.     Capillary Refill: Capillary refill takes less than 2 seconds.  Neurological:     General: No focal deficit present.     Mental Status: He is alert.  Psychiatric:        Mood and Affect: Mood normal.   Left knee exam demonstrates no joint effusion.  There is cellulitis around the patella knee extending down to the lateral and medial calf region.  This is outlined today.  Pedal pulses palpable.  Compartments are soft.  No tissue crepitus present.  No groin pain with internal/external Tatian of the left leg.  Right lower extremity and bilateral upper extremities have good range of motion of the joints with no tenderness to palpation fluctuance or erythema around any of those joints.  There is an abrasion just superior to the tibial tubercle which measures about 8 x 8 mm. Assessment/Plan: Impression is left knee septic prepatellar bursitis with no intra-articular knee joint effusion.  Plan is septic bursal excision.  He does have a small abrasion at the superior aspect of the tibial tubercle.  This may be the entry point.  No  fluctuance induration around the lower leg indicating abscess.  Risk and benefits of surgery were discussed.  All questions answered.  Patient may have a drain which we will leave in at least overnight.  Plan on loose closure to allow for drainage.  All questions answered.  Landry Dyke Cintya Daughety 07/20/2022, 3:43 PM

## 2022-07-20 NOTE — ED Notes (Signed)
Click off cardic monitoring by accident patient refused monitoring

## 2022-07-20 NOTE — ED Notes (Signed)
Pt's daughter at bedside Naida Sleight) update given to daughter, Hershal Coria Will also at bedside to give daughter update and to update pt regarding admission into hospital

## 2022-07-20 NOTE — Op Note (Unsigned)
Johnathan Brown, PLEVA MEDICAL RECORD NO: [id number] ACCOUNT NO: [id number] DATE OF BIRTH: 28-Mar-1976 FACILITY: [id number] PHYSICIAN: Yetta Barre. Marlou Sa, MD  Operative Report   DATE OF PROCEDURE: 07/20/2022  PREOPERATIVE DIAGNOSIS:  Left knee prepatellar septic bursitis.  POSTOPERATIVE DIAGNOSIS:  Left knee prepatellar septic bursitis.  PROCEDURE:  Left knee excisional debridement of the septic prepatellar bursa with closure over Hemovac drain.  SURGEON:  Yetta Barre. Marlou Sa, MD  ASSISTANT:  Annie Main, PA, Arlan Organ, MS3.  INDICATIONS:  This is a 47 year old patient with left knee pain of several months' duration.  He has a history of IV drug use.  He presents now for operative management of septic prepatellar bursitis, which has been refractory to nonoperative treatment.  DESCRIPTION OF PROCEDURE:  The patient was brought to the operating room where general anesthetic was induced.  Perioperative IV antibiotics were continued.  The timeout was called.  Left leg was prepped with Hibiclens.  Incision made over the  prepatellar bursa.  Small area of granulation abrasion was excised.  There was purulent fluid under pressure, which was cultured x2.  A bursectomy performed using dissecting scissors, curettes and rongeur.  Thorough irrigation with pulsatile irrigation  x3 liters and pouring irrigation x1 liter was performed.  Bleeding points encountered controlled using electrocautery.  Hemovac drain was placed.  The underlying fascia was tacked down to the underlying subdermal skin using several Vicryl sutures and  then the skin was loosely reapproximated using nylon sutures.  Hemovac drain was pulled partially and there was no interference with the drain.  Bulky wrap was placed around the knee along with the knee immobilizer.  There was no left knee effusion.  The  patient tolerated the procedure well without immediate complication.  Luke's assistance was required at all times for  retraction, opening, closing, mobilization of tissue, dissection.  His assistance was a medical necessity.   PUS D: 07/20/2022 6:18:41 pm T: 07/20/2022 9:10:00 pm  JOB: [id number]

## 2022-07-20 NOTE — ED Provider Notes (Signed)
[facility] AT [facility] Provider Note   CSN: [id number] Arrival date & time: 07/19/22  2152     History  Chief Complaint  Patient presents with   Joint Swelling    Johnathan Brown is a 47 y.o. male.  HPI   Patient with medical history including intravenous drug use, GERD, presenting with complaints of left-sided leg pain, pain started about 3 days ago, came on suddenly, pain has gotten worse, he does notice worsening swelling and erythema around his leg, no associated fevers chills cough congestion denies any chest pain or shortness of breath, he denies any other pain in his upper extremities or right lower extreme denies any back pain.  Denies any trauma to his knee.  He states that he had an issue with his knee earlier in the month but after given antibiotics his got better.  He admits to intravenous drug use about 2 days ago, denies injecting the joint, he states that he has attempted to drain his knee in the past but has been unsuccessful.  I have reviewed patient's chart was seen earlier in the month, seen at [facility], admitted for cellulitis of the left knee, started on IV vancomycin, patient left AMA, he was seen on 6 March was given IV vancomycin, patient was given option to be admitted or following up with patient chose to follow-up.    Home Medications Prior to Admission medications   Medication Sig Start Date End Date Taking? Authorizing Provider  amphetamine-dextroamphetamine (ADDERALL) 20 MG tablet Take 20 mg by mouth 2 (two) times daily.   Yes [provider]  buprenorphine (SUBUTEX) 8 MG SUBL SL tablet Place 8 mg under the tongue daily.   Yes [provider]  doxycycline (VIBRAMYCIN) 100 MG capsule Take 1 capsule (100 mg total) by mouth 2 (two) times daily. Patient not taking: Reported on 07/19/2022 06/27/22   Valarie Merino, MD      Allergies    Patient has no known allergies.    Review of Systems   Review of  Systems  Constitutional:  Negative for chills and fever.  Respiratory:  Negative for shortness of breath.   Cardiovascular:  Negative for chest pain.  Gastrointestinal:  Negative for abdominal pain.  Musculoskeletal:        Left-sided knee pain  Neurological:  Negative for headaches.    Physical Exam Updated Vital Signs BP (!) 111/54   Pulse (!) 105   Temp 98.9 F (37.2 C) (Oral) Comment: Pt refuse rectal temp  Resp 19   Ht 5\' 10"  (1.778 m)   Wt 76.7 kg   SpO2 97%   BMI 24.25 kg/m  Physical Exam Vitals and nursing note reviewed.  Constitutional:      General: He is in acute distress.     Appearance: He is ill-appearing.  HENT:     Head: Normocephalic and atraumatic.     Nose: No congestion.     Mouth/Throat:     Mouth: Mucous membranes are dry.     Pharynx: Oropharynx is clear.  Eyes:     Conjunctiva/sclera: Conjunctivae normal.  Cardiovascular:     Rate and Rhythm: Regular rhythm. Tachycardia present.     Pulses: Normal pulses.     Heart sounds: No murmur heard.    No friction rub. No gallop.  Pulmonary:     Effort: No respiratory distress.     Breath sounds: No wheezing, rhonchi or rales.  Abdominal:     Palpations:  Abdomen is soft.     Tenderness: There is no abdominal tenderness. There is no right CVA tenderness or left CVA tenderness.  Musculoskeletal:     Comments: Spine was palpated was nontender to palpation no step-off deformities noted there is no overlying skin changes.  Left knee was erythematous, edematous, warm to the touch, there is appears to be a large effusion noted over the patella, no active drainage or discharge present, he has no palpable cords no calf tenderness, he has 2+ dorsal pedal pulses, sensation intact to light touch.  Please see picture for full detail  Skin:    General: Skin is warm and dry.     Comments: Noted track marks on the left [facility] without evidence of infection  Neurological:     Mental Status: He is alert.  Psychiatric:         Mood and Affect: Mood normal.          ED Results / Procedures / Treatments   Labs (all labs ordered are listed, but only abnormal results are displayed) Labs Reviewed  LACTIC ACID, PLASMA - Abnormal; Notable for the following components:      Result Value   Lactic Acid, Venous 2.0 (*)    All other components within normal limits  LACTIC ACID, PLASMA - Abnormal; Notable for the following components:   Lactic Acid, Venous 2.4 (*)    All other components within normal limits  COMPREHENSIVE METABOLIC PANEL - Abnormal; Notable for the following components:   Sodium 133 (*)    Chloride 97 (*)    Glucose, Bld 107 (*)    Calcium 8.7 (*)    Total Protein 8.2 (*)    All other components within normal limits  CBC WITH DIFFERENTIAL/PLATELET - Abnormal; Notable for the following components:   WBC 20.9 (*)    Neutro Abs 18.3 (*)    Monocytes Absolute 1.5 (*)    Abs Immature Granulocytes 0.18 (*)    All other components within normal limits  RESP PANEL BY RT-PCR (RSV, FLU A&B, COVID)  [id number]  CULTURE, BLOOD (ROUTINE X 2)  CULTURE, BLOOD (ROUTINE X 2)  PROTIME-INR  APTT  URINALYSIS, W/ REFLEX TO CULTURE (INFECTION SUSPECTED)    EKG None  Radiology DG Knee Complete 4 Views Left  Result Date: 07/19/2022 CLINICAL DATA:  Septic joint.  Left knee pain and infection EXAM: LEFT KNEE - COMPLETE 4+ VIEW COMPARISON:  06/27/2022 FINDINGS: No evidence of acute fracture or dislocation of the left knee. No focal bone lesion or bone destruction. No cortical erosion or sclerosis to suggest evidence of osteomyelitis. Joint spaces are normal. No significant effusion. Subcutaneous soft tissue swelling anterior to the patella and patellar ligament similar to prior study. No soft tissue gas or radiopaque foreign body demonstrated. IMPRESSION: 1. Soft tissue swelling anterior to the patella and patellar ligament. No significant effusion. 2. No acute bony abnormalities. Electronically Signed   By:  Lucienne Capers M.D.   On: 07/19/2022 23:50    Procedures .Critical Care  Performed by: Marcello Fennel, PA-C Authorized by: Marcello Fennel, PA-C   Critical care provider statement:    Critical care time (minutes):  30   Critical care time was exclusive of:  Separately billable procedures and treating other patients   Critical care was necessary to treat or prevent imminent or life-threatening deterioration of the following conditions:  Sepsis   Critical care was time spent personally by me on the following activities:  Discussions with consultants,  evaluation of patient's response to treatment, examination of patient, ordering and review of laboratory studies, ordering and review of radiographic studies, ordering and performing treatments and interventions, pulse oximetry, re-evaluation of patient's condition and review of old charts   I assumed direction of critical care for this patient from another provider in my specialty: no     Care discussed with: admitting provider       Medications Ordered in ED Medications  lactated ringers infusion ( Intravenous New Bag/Given 07/20/22 0034)  sodium chloride 0.9 % bolus 1,000 mL (0 mLs Intravenous Stopped 07/20/22 0018)    And  sodium chloride 0.9 % bolus 1,000 mL (0 mLs Intravenous Stopped 07/20/22 0127)    And  sodium chloride 0.9 % bolus 500 mL (0 mLs Intravenous Stopped 07/20/22 0109)  vancomycin (VANCOCIN) IVPB 1000 mg/200 mL premix (0 mg Intravenous Stopped 07/20/22 0048)  cefTRIAXone (ROCEPHIN) 2 g in sodium chloride 0.9 % 100 mL IVPB (0 g Intravenous Stopped 07/20/22 0009)  HYDROmorphone (DILAUDID) injection 1 mg (1 mg Intravenous Given 07/20/22 0025)  acetaminophen (TYLENOL) tablet 1,000 mg (1,000 mg Oral Given 07/20/22 0024)  ondansetron (ZOFRAN) injection 4 mg (4 mg Intravenous Given 07/20/22 0024)  LORazepam (ATIVAN) injection 1 mg (1 mg Intravenous Given 07/20/22 0028)  HYDROmorphone (DILAUDID) injection 1 mg (1 mg Intravenous  Given 07/20/22 0139)  HYDROmorphone (DILAUDID) injection 1 mg (1 mg Intravenous Given 07/20/22 0236)    ED Course/ Medical Decision Making/ A&P                             Medical Decision Making Amount and/or Complexity of Data Reviewed Labs: ordered. Radiology: ordered. ECG/medicine tests: ordered.  Risk OTC drugs. Prescription drug management. Decision regarding hospitalization.   This patient presents to the ED for concern of left knee pain, this involves an extensive number of treatment options, and is a complaint that carries with it a high risk of complications and morbidity.  The differential diagnosis includes septic arthritis, cellulitis, septic bursa, osteomyelitis,    Additional history obtained:  Additional history obtained from partner at bedside External records from outside source obtained and reviewed including recent ER   Co morbidities that complicate the patient evaluation  Polysubstance dependency  Social Determinants of Health:  No primary care provider     Lab Tests:  I Ordered, and personally interpreted labs.  The pertinent results include: CBC shows leukocytosis of 20.9, CMP reveals sodium 133, chloride 97, glucose 107, calcium 8.7, total protein 8.2, prothrombin time INR unremarkable, APTT unremarkable, lactic 2.4,   Imaging Studies ordered:  I ordered imaging studies including x-ray of the left knee I independently visualized and interpreted imaging which showed soft tissue swelling without joint effusion I agree with the radiologist interpretation   Cardiac Monitoring:  The patient was maintained on a cardiac monitor.  I personally viewed and interpreted the cardiac monitored which showed an underlying rhythm of: Patient is refusing   Medicines ordered and prescription drug management:  I ordered medication including fluids I have reviewed the patients home medicines and have made adjustments as needed  Critical  Interventions:  Patient presents septic, tachycardic, rectal temperature 104, likely sources left septic joint, will start on IV antibiotics fluids and continue to monitor Patient's lactic is downtrending, vital signs are improving, will continue to monitor.   Reevaluation:  Patient is assessed, still endorsing pain, will provide him with some Ativan he appears slightly anxious and will continue  to monitor.  Patient is reassessed resting comfortably, he is agreement with admission at this time    Consultations Obtained:  I requested consultation with the Dr. Levie Heritage,  and discussed lab and imaging findings as well as pertinent plan - they recommend: She will admit the patient.    Test Considered:  Joint tap-this will be deferred as entire area of the knee is erythematous and there is not good access to obtain joint tap without going through infectious skin    Rule out Suspicion for endocarditis is low at this time not endorsing chest pain or shortness of breath, no new heart murmurs present during my examination.   low suspicion for fracture or dislocation as x-ray does not feel any significant findings.  Suspicion for DVT is also low at this time as he has no unilateral leg swelling no calf tenderness no palpable cords.  Low suspicion for compartment syndrome as area was palpated it was soft to the touch, neurovascular fully intact.     Dispostion and problem list  After consideration of the diagnostic results and the patients response to treatment, I feel that the patent would benefit from admission.  Sepsis-secondary infection of the knee, unclear if this is cellulitis versus possible septic joint, patient will need formal consultation by orthopedic for further evaluation manage continue IV antibiotics.            Final Clinical Impression(s) / ED Diagnoses Final diagnoses:  Sepsis without acute organ dysfunction, due to unspecified organism [facility])  Knee swelling     Rx / DC Orders ED Discharge Orders     None         Marcello Fennel, PA-C 07/20/22 0457    Orpah Greek, MD 07/21/22 [phone]

## 2022-07-20 NOTE — Progress Notes (Signed)
PROGRESS NOTE    WAI SOLT  [id number] DOB: [birth year] DOA: 07/19/2022 PCP: Patient, No Pcp Per    Chief Complaint  Patient presents with   Joint Swelling    Brief Narrative:  Patient is a 47 year old gentleman history of polysubstance abuse with IV drug use of heroin, cocaine, methamphetamines, tobacco abuse, GERD.  Patient admitted at [facility] on 06/23/2022 for left knee cellulitis however left AMA.  Patient returned back to the ED on 06/27/2022 received a dose of IV vancomycin, discharge with a prescription for doxycycline as patient declined hospital admission at that time.  Patient took 5 days worth of doxycycline presented back to the ED for worsening infection of the left knee.  Patient seen in the ED noted to be septic with fever with a temp of 104, tachycardic, tachypneic, soft/low blood pressure, leukocytosis, lactic acidosis.  Plain films of the left knee done with soft tissue swelling anterior to the patella and patellar ligament without significant effusion, no acute bone abnormalities.  Patient placed empirically on IV vancomycin, IV Rocephin, IV fluids.  ID consulted.  Orthopedic consulted.   Assessment & Plan:   Principal Problem:   Severe sepsis ([place]) Active Problems:   Infection of left knee ([place])   Sepsis without acute organ dysfunction (H[facility])   Substance abuse ([place])   Hyponatremia  #1 severe sepsis secondary to left knee infection/concern for septic arthritis -Patient initially presented to [facility] on 06/23/2022 with left knee cellulitis left AMA.  Returned back to the ED 06/27/2022 received dose of IV vancomycin discharged on doxycycline as patient refused admission at that time.  Patient took 5 days worth of doxycycline presented back to the ED with worsening left knee pain, swelling, concern for infection. -Patient met criteria for severe sepsis with a lactic acidosis, fever, tachycardia, soft/borderline blood pressure, leukocytosis, lactic acidosis. -Plan films of the  left knee with soft tissue swelling anterior to the patella and patellar ligament without significant effusion, no acute bone abnormalities. -Blood cultures ordered and pending. -Patient seen in consultation by ID who have ordered RPR, hepatitis serology, HIV and concern for bacteremia. -Increase IV Dilaudid to 2 mg every 3 hours as needed for better pain control. -IV antiemetics, supportive care. -Continue IV vancomycin.  IV Rocephin changed to IV cefazolin.  ID recommendations. -Orthopedics consulted for further evaluation due to concern for need for surgical intervention. -Supportive care.  2.  Mild hyponatremia -Likely secondary to dehydration. -Continue IV fluids.  3.  Polysubstance abuse -Patient with polysubstance abuse including active IV heroin abuse, cocaine use, methamphetamines. -UDS done positive for amphetamines, benzos, opiates, cocaine. -Cessation stressed to patient. -Continue withdrawal protocol as needed. -Consult with TOC.     DVT prophylaxis: SCDs Code Status: Full Family Communication: Updated patient, daughter at bedside. Disposition: TBD  Status is: Inpatient Remains inpatient appropriate because: Severity of illness.   Consultants:  ID: Dr. Juleen China 07/20/2022 Orthopedics: Dr. Marlou Sa pending  Procedures:  Left knee plain films 07/19/2022  Antimicrobials:  IV Rocephin 07/19/2022>>> 07/20/2022 IV Ancef 07/20/2022 IV vancomycin 07/19/2022>>>>   Subjective: Patient laying in bed.  Complains of pain all over.  Feels some improvement with left knee pain after IV antibiotics.  Left knee less stiff.  Denies any chest pain.  Some complaints of abdominal cramping.  Some shortness of breath.  States he is very hungry and has not had any water or food since admission.  Patient states only took 5 days of oral doxycycline.  Objective: Vitals:   07/20/22 0502 07/20/22  [id number] 07/20/22 0840 07/20/22 1122  BP: 93/72  (!) 110/58 103/72  Pulse: 96  (!) 103 (!) 109  Resp:  19  20 20   Temp: 98.4 F (36.9 C)  97.8 F (36.6 C) (!) 101.2 F (38.4 C)  TempSrc: Oral  Oral Oral  SpO2: 94%  94% 95%  Weight:      Height:  5\' 10"  (1.778 m)      Intake/Output Summary (Last 24 hours) at 07/20/2022 1526 Last data filed at 07/20/2022 1100 Gross per 24 hour  Intake 3461.72 ml  Output 0 ml  Net 3461.72 ml   Filed Weights   07/19/22 2211  Weight: 76.7 kg    Examination:  General exam: Appears somewhat uncomfortable. Respiratory system: Clear to auscultation bilaterally, no wheezes, no crackles, no rhonchi.  Fair air movement.  Speaking in full sentences. Respiratory effort normal. Cardiovascular system: Regular rate rhythm no murmurs rubs or gallops.  No JVD.  No lower extremity edema.  Gastrointestinal system: Abdomen is nondistended, soft and nontender. No organomegaly or masses felt. Normal bowel sounds heard. Central nervous system: Alert and oriented.  Moving extremities spontaneously.  No focal neurological deficits. Extremities: Left knee erythematous, edematous, exquisitely tender to palpation, warmth.  Streaking noted up left thigh.  Symmetric 5 x 5 power. Skin: No rashes, lesions or ulcers Psychiatry: Judgement and insight appear fair. Mood & affect appropriate.     Data Reviewed: I have personally reviewed following labs and imaging studies  CBC: Recent Labs  Lab 07/19/22 2243 07/20/22 0525  WBC 20.9* 20.3*  NEUTROABS 18.3*  --   HGB 14.3 12.3*  HCT 44.2 38.1*  MCV 88.4 88.8  PLT 180 0000000    Basic Metabolic Panel: Recent Labs  Lab 07/19/22 2243 07/20/22 0525  NA 133* 130*  K 3.9 3.8  CL 97* 99  CO2 25 22  GLUCOSE 107* 132*  BUN 19 15  CREATININE 1.06 0.98  CALCIUM 8.7* 7.5*    GFR: Estimated Creatinine Clearance: 98.3 mL/min (by C-G formula based on SCr of 0.98 mg/dL).  Liver Function Tests: Recent Labs  Lab 07/19/22 2243  AST 23  ALT 16  ALKPHOS 96  BILITOT 0.9  PROT 8.2*  ALBUMIN 4.0    CBG: No results for  input(s): "GLUCAP" in the last 168 hours.   Recent Results (from the past 240 hour(s))  Resp panel by RT-PCR (RSV, Flu A&B, Covid) Anterior Nasal Swab     Status: None   Collection Time: 07/20/22  1:06 AM   Specimen: Anterior Nasal Swab  Result Value Ref Range Status   SARS Coronavirus 2 by RT PCR NEGATIVE NEGATIVE Final    Comment: (NOTE) SARS-CoV-2 target nucleic acids are NOT DETECTED.  The SARS-CoV-2 RNA is generally detectable in upper respiratory specimens during the acute phase of infection. The lowest concentration of SARS-CoV-2 viral copies this assay can detect is 138 copies/mL. A negative result does not preclude SARS-Cov-2 infection and should not be used as the sole basis for treatment or other patient management decisions. A negative result may occur with  improper specimen collection/handling, submission of specimen other than nasopharyngeal swab, presence of viral mutation(s) within the areas targeted by this assay, and inadequate number of viral copies(<138 copies/mL). A negative result must be combined with clinical observations, patient history, and epidemiological information. The expected result is Negative.  Fact Sheet for Patients:  EntrepreneurPulse.com.au  Fact Sheet for Healthcare Providers:  IncredibleEmployment.be  This test is no t yet approved or cleared  by the [place] and  has been authorized for detection and/or diagnosis of SARS-CoV-2 by FDA under an Emergency Use Authorization (EUA). This EUA will remain  in effect (meaning this test can be used) for the duration of the COVID-19 declaration under Section 564(b)(1) of the Act, 21 U.S.C.section 360bbb-3(b)(1), unless the authorization is terminated  or revoked sooner.       Influenza A by PCR NEGATIVE NEGATIVE Final   Influenza B by PCR NEGATIVE NEGATIVE Final    Comment: (NOTE) The Xpert Xpress SARS-CoV-2/FLU/RSV plus assay is intended as an  aid in the diagnosis of influenza from Nasopharyngeal swab specimens and should not be used as a sole basis for treatment. Nasal washings and aspirates are unacceptable for Xpert Xpress SARS-CoV-2/FLU/RSV testing.  Fact Sheet for Patients: EntrepreneurPulse.com.au  Fact Sheet for Healthcare Providers: IncredibleEmployment.be  This test is not yet approved or cleared by the [place] FDA and has been authorized for detection and/or diagnosis of SARS-CoV-2 by FDA under an Emergency Use Authorization (EUA). This EUA will remain in effect (meaning this test can be used) for the duration of the COVID-19 declaration under Section 564(b)(1) of the Act, 21 U.S.C. section 360bbb-3(b)(1), unless the authorization is terminated or revoked.     Resp Syncytial Virus by PCR NEGATIVE NEGATIVE Final    Comment: (NOTE) Fact Sheet for Patients: EntrepreneurPulse.com.au  Fact Sheet for Healthcare Providers: IncredibleEmployment.be  This test is not yet approved or cleared by the [place] FDA and has been authorized for detection and/or diagnosis of SARS-CoV-2 by FDA under an Emergency Use Authorization (EUA). This EUA will remain in effect (meaning this test can be used) for the duration of the COVID-19 declaration under Section 564(b)(1) of the Act, 21 U.S.C. section 360bbb-3(b)(1), unless the authorization is terminated or revoked.  Performed at [facility], [place] [address], [place], [place] [address]          Radiology Studies: DG Knee Complete 4 Views Left  Result Date: 07/19/2022 CLINICAL DATA:  Septic joint.  Left knee pain and infection EXAM: LEFT KNEE - COMPLETE 4+ VIEW COMPARISON:  06/27/2022 FINDINGS: No evidence of acute fracture or dislocation of the left knee. No focal bone lesion or bone destruction. No cortical erosion or sclerosis to suggest evidence of osteomyelitis. Joint  spaces are normal. No significant effusion. Subcutaneous soft tissue swelling anterior to the patella and patellar ligament similar to prior study. No soft tissue gas or radiopaque foreign body demonstrated. IMPRESSION: 1. Soft tissue swelling anterior to the patella and patellar ligament. No significant effusion. 2. No acute bony abnormalities. Electronically Signed   By: Lucienne Capers M.D.   On: 07/19/2022 23:50        Scheduled Meds: Continuous Infusions:  [MAR Hold]  ceFAZolin (ANCEF) IV     lactated ringers 150 mL/hr at 07/20/22 0722   [MAR Hold] vancomycin 1,000 mg (07/20/22 1022)     LOS: 0 days    Time spent: 40 minutes  No charge.    Irine Seal, MD [facility]   To contact the attending provider between 7A-7P or the covering provider during after hours 7P-7A, please log into the web site [email or web address] and access using universal [place] password for that web site. If you do not have the password, please call the hospital operator.  07/20/2022, 3:26 PM

## 2022-07-20 NOTE — ED Notes (Signed)
PA Will Ileene Patrick at the bedside to speak with pt about his care, refusing treatment, and pt voicing that he is wanting to leave

## 2022-07-20 NOTE — Progress Notes (Signed)
Pharmacy Antibiotic Note  Johnathan Brown is a 47 y.o. male admitted on 07/19/2022 with cellulitis.  Pharmacy has been consulted for Vancomycin dosing.  Plan: Vancomycin 1gm IV q12h to target AUC 400-550. Estimated AUC on this regimen 454 Monitor renal function and cx data  Check Vancomycin levels at steady state  Height: 5\' 10"  (177.8 cm) Weight: 76.7 kg (169 lb) IBW/kg (Calculated) : 73  Temp (24hrs), Avg:100.4 F (38 C), Min:98.3 F (36.8 C), Max:104 F (40 C)  Recent Labs  Lab 07/19/22 2243 07/19/22 2358 07/20/22 0132  WBC 20.9*  --   --   CREATININE 1.06  --   --   LATICACIDVEN  --  2.4* 2.0*    Estimated Creatinine Clearance: 90.9 mL/min (by C-G formula based on SCr of 1.06 mg/dL).    No Known Allergies  Antimicrobials this admission: 3/28 Rocephin >>  3/28 Vancomycin >>   Dose adjustments this admission:  Microbiology results: 3/28 BCx:   Thank you for allowing pharmacy to be a part of this patient's care.  Netta Cedars PharmD 07/20/2022 4:44 AM

## 2022-07-20 NOTE — ED Notes (Signed)
Pt refusing Tylenol, pt cussing at this RN, "stating no one cares, Tylenol is not going to do anything, No! No Tylenol" This RN attempted to explain that Tylenol is for his fever, pt still refusing, stated that Dilaudid is also order that may help for his pain, advised pt that if he takes the Tylenol then we can give the dilaudid for pain and also the provider has order ativan as well. Pt very hostile, friend at the bedside attempting to assist with pt being complaint, however pt is irritable, continues to state he wants to leave, only wants meds for his knee, "I did not ask for anything else, you are not treating me for what I came in for, make sure you record all this, they doing what they want and not for my knee". Attempted to explain to pt that d/t his knee being infected it has caused his body to become septic so we are trying to treat him accordingly. Pt advised wants a new nurse and wants a "print out of everything you gave me" Friend at bedside continues to try to redirect pt and calm him down.

## 2022-07-20 NOTE — Sepsis Progress Note (Signed)
Following for sepsis monitoring ?

## 2022-07-20 NOTE — ED Notes (Signed)
ED TO INPATIENT HANDOFF REPORT  ED Nurse Name and Phone #:  Leafy Ro RN  S Name/Age/Gender Johnathan Brown 47 y.o. male Room/Bed: WA16/WA16  Code Status   Code Status: Prior  Home/SNF/Other Home Patient oriented to: self, place, time, and situation Is this baseline? Yes   Triage Complete: Triage complete  Chief Complaint Infection of left knee [M00.9]  Triage Note Patient coming to ED for evaluation of swelling and redness to L knee.  Reports swelling and possible infection started three weeks ago.  Tried to "drain the infection by sticking a needle in it" three days ago and redness has gotten worse.  Was seen three weeks ago for same and was getting IV antibiotics.  C/o chills and body aches   Allergies No Known Allergies  Level of Care/Admitting Diagnosis ED Disposition     ED Disposition  Admit   Condition  --   Comment  Hospital Area: [facility] [[id number]]  Level of Care: Progressive [102]  Admit to Progressive based on following criteria: MULTISYSTEM THREATS such as stable sepsis, metabolic/electrolyte imbalance with or without encephalopathy that is responding to early treatment.  May [facility] to [facility] if equivalent level of care is available:: Yes  Covid Evaluation: Asymptomatic - no recent exposure (last 10 days) testing not required  Diagnosis: Infection of left knee [id number]  Admitting Physician: Shela Leff [id number]  Attending Physician: Shela Leff [id number]  Certification:: I certify this patient will need inpatient services for at least 2 midnights  Estimated Length of Stay: 2          B Medical/Surgery History Past Medical History:  Diagnosis Date   Abscess 01/2015   GERD (gastroesophageal reflux disease)    Past Surgical History:  Procedure Laterality Date   I & D EXTREMITY Right 01/25/2015   Procedure: MINOR IRRIGATION AND DEBRIDEMENT RIGHT ARM;  Surgeon: Newt Minion, MD;   Location: [facility];  Service: Orthopedics;  Laterality: Right;   INNER EAR SURGERY     x 5      A IV Location/Drains/Wounds Patient Lines/Drains/Airways Status     Active Line/Drains/Airways     Name Placement date Placement time Site Days   Peripheral IV 07/19/22 18 G Left;Posterior;Proximal Forearm 07/19/22  2236  Forearm  1   Peripheral IV 07/19/22 20 G Left;Posterior Hand 07/19/22  2330  Hand  1            Intake/Output Last 24 hours  Intake/Output Summary (Last 24 hours) at 07/20/2022 0423 Last data filed at 07/20/2022 0127 Gross per 24 hour  Intake 2800 ml  Output --  Net 2800 ml    Labs/Imaging Results for orders placed or performed during the hospital encounter of 07/19/22 (from the past 48 hour(s))  Comprehensive metabolic panel     Status: Abnormal   Collection Time: 07/19/22 10:43 PM  Result Value Ref Range   Sodium 133 (L) 135 - 145 mmol/L   Potassium 3.9 3.5 - 5.1 mmol/L   Chloride 97 (L) 98 - 111 mmol/L   CO2 25 22 - 32 mmol/L   Glucose, Bld 107 (H) 70 - 99 mg/dL    Comment: Glucose reference range applies only to samples taken after fasting for at least 8 hours.   BUN 19 6 - 20 mg/dL   Creatinine, Ser 1.06 0.61 - 1.24 mg/dL   Calcium 8.7 (L) 8.9 - 10.3 mg/dL   Total Protein 8.2 (H) 6.5 - 8.1 g/dL  Albumin 4.0 3.5 - 5.0 g/dL   AST 23 15 - 41 U/L   ALT 16 0 - 44 U/L   Alkaline Phosphatase 96 38 - 126 U/L   Total Bilirubin 0.9 0.3 - 1.2 mg/dL   GFR, Estimated >60 >60 mL/min    Comment: (NOTE) Calculated using the CKD-EPI Creatinine Equation (2021)    Anion gap 11 5 - 15    Comment: Performed at [facility], [place] [address], [place], [place] [address]  CBC with Differential     Status: Abnormal   Collection Time: 07/19/22 10:43 PM  Result Value Ref Range   WBC 20.9 (H) 4.0 - 10.5 K/uL   RBC 5.00 4.22 - 5.81 MIL/uL   Hemoglobin 14.3 13.0 - 17.0 g/dL   HCT 44.2 39.0 - 52.0 %   MCV 88.4 80.0 - 100.0 fL   MCH 28.6 26.0 - 34.0 pg    MCHC 32.4 30.0 - 36.0 g/dL   RDW 13.9 11.5 - 15.5 %   Platelets 180 150 - 400 K/uL   nRBC 0.0 0.0 - 0.2 %   Neutrophils Relative % 88 %   Neutro Abs 18.3 (H) 1.7 - 7.7 K/uL   Lymphocytes Relative 4 %   Lymphs Abs 0.8 0.7 - 4.0 K/uL   Monocytes Relative 7 %   Monocytes Absolute 1.5 (H) 0.1 - 1.0 K/uL   Eosinophils Relative 0 %   Eosinophils Absolute 0.0 0.0 - 0.5 K/uL   Basophils Relative 0 %   Basophils Absolute 0.1 0.0 - 0.1 K/uL   Immature Granulocytes 1 %   Abs Immature Granulocytes 0.18 (H) 0.00 - 0.07 K/uL    Comment: Performed at [facility], [place] [address] [place], [place] [address]  Protime-INR     Status: None   Collection Time: 07/19/22 10:43 PM  Result Value Ref Range   Prothrombin Time 14.1 11.4 - 15.2 seconds   INR 1.1 0.8 - 1.2    Comment: (NOTE) INR goal varies based on device and disease states. Performed at [facility], [place] [address] [place], [place] [address]   APTT     Status: None   Collection Time: 07/19/22 10:43 PM  Result Value Ref Range   aPTT 31 24 - 36 seconds    Comment: Performed at [facility], [place] [address], [place], [place] [address]  Lactic acid, plasma     Status: Abnormal   Collection Time: 07/19/22 11:58 PM  Result Value Ref Range   Lactic Acid, Venous 2.4 (HH) 0.5 - 1.9 mmol/L    Comment: CRITICAL RESULT CALLED TO, READ BACK BY AND VERIFIED WITH I. CORTES, RN 07/20/22 0026 J. COLE Performed at [facility], [place] [address], [place], [place] [address]   Resp panel by RT-PCR (RSV, Flu A&B, Covid) Anterior Nasal Swab     Status: None   Collection Time: 07/20/22  1:06 AM   Specimen: Anterior Nasal Swab  Result Value Ref Range   SARS Coronavirus 2 by RT PCR NEGATIVE NEGATIVE    Comment: (NOTE) SARS-CoV-2 target nucleic acids are NOT DETECTED.  The SARS-CoV-2 RNA is generally detectable in upper respiratory specimens during the acute phase of infection. The  lowest concentration of SARS-CoV-2 viral copies this assay can detect is 138 copies/mL. A negative result does not preclude SARS-Cov-2 infection and should not be used as the sole basis for treatment or other patient management decisions. A negative result may occur with  improper specimen collection/handling, submission of specimen other than nasopharyngeal swab, presence of viral mutation(s) within the areas targeted by this assay, and inadequate number of viral copies(<138 copies/mL). A negative result must be combined with clinical observations, patient history, and epidemiological information. The expected result is Negative.  Fact Sheet for Patients:  EntrepreneurPulse.com.au  Fact Sheet for Healthcare Providers:  IncredibleEmployment.be  This test is no t yet approved or cleared by the [place] FDA and  has been authorized for detection and/or diagnosis of SARS-CoV-2 by FDA under an Emergency Use Authorization (EUA). This EUA will remain  in effect (meaning this test can be used) for the duration of the COVID-19 declaration under Section 564(b)(1) of the Act, 21 U.S.C.section 360bbb-3(b)(1), unless the authorization is terminated  or revoked sooner.       Influenza A by PCR NEGATIVE NEGATIVE   Influenza B by PCR NEGATIVE NEGATIVE    Comment: (NOTE) The Xpert Xpress SARS-CoV-2/FLU/RSV plus assay is intended as an aid in the diagnosis of influenza from Nasopharyngeal swab specimens and should not be used as a sole basis for treatment. Nasal washings and aspirates are unacceptable for Xpert Xpress SARS-CoV-2/FLU/RSV testing.  Fact Sheet for Patients: EntrepreneurPulse.com.au  Fact Sheet for Healthcare Providers: IncredibleEmployment.be  This test is not yet approved or cleared by the [place] FDA and has been authorized for detection and/or diagnosis of SARS-CoV-2 by FDA under an Emergency  Use Authorization (EUA). This EUA will remain in effect (meaning this test can be used) for the duration of the COVID-19 declaration under Section 564(b)(1) of the Act, 21 U.S.C. section 360bbb-3(b)(1), unless the authorization is terminated or revoked.     Resp Syncytial Virus by PCR NEGATIVE NEGATIVE    Comment: (NOTE) Fact Sheet for Patients: EntrepreneurPulse.com.au  Fact Sheet for Healthcare Providers: IncredibleEmployment.be  This test is not yet approved or cleared by the [place] FDA and has been authorized for detection and/or diagnosis of SARS-CoV-2 by FDA under an Emergency Use Authorization (EUA). This EUA will remain in effect (meaning this test can be used) for the duration of the COVID-19 declaration under Section 564(b)(1) of the Act, 21 U.S.C. section 360bbb-3(b)(1), unless the authorization is terminated or revoked.  Performed at [facility], [place] [address] [place], [place] [address]   Lactic acid, plasma     Status: Abnormal   Collection Time: 07/20/22  1:32 AM  Result Value Ref Range   Lactic Acid, Venous 2.0 (HH) 0.5 - 1.9 mmol/L    Comment: CRITICAL VALUE NOTED. VALUE IS CONSISTENT WITH PREVIOUSLY REPORTED/CALLED VALUE Performed at [place] [address] [place], [place] [address]    DG Knee Complete 4 Views Left  Result Date: 07/19/2022 CLINICAL DATA:  Septic joint.  Left knee pain and infection EXAM: LEFT KNEE - COMPLETE 4+ VIEW COMPARISON:  06/27/2022 FINDINGS: No evidence of acute fracture or dislocation of the left knee. No focal bone lesion or bone destruction. No cortical erosion or sclerosis to suggest evidence of osteomyelitis. Joint spaces are normal. No significant effusion. Subcutaneous soft tissue swelling anterior to the patella and patellar ligament similar to prior study. No soft tissue gas or radiopaque foreign body demonstrated. IMPRESSION: 1. Soft tissue  swelling anterior to the patella and patellar ligament. No significant effusion. 2. No acute bony abnormalities. Electronically Signed   By: Lucienne Capers M.D.   On: 07/19/2022 23:50    Pending Labs [employer] (From admission, onward)     Start  Ordered   07/19/22 2329  Blood Culture (routine x 2)  (Septic presentation on arrival (screening labs, nursing and treatment orders for obvious sepsis))  BLOOD CULTURE X 2,   STAT      07/19/22 2329   07/19/22 2329  Urinalysis, w/ Reflex to Culture (Infection Suspected) -Urine, Clean Catch  (Septic presentation on arrival (screening labs, nursing and treatment orders for obvious sepsis))  Once,   URGENT       Question:  Specimen Source  Answer:  Urine, Clean Catch   07/19/22 2329            Vitals/Pain Today's Vitals   07/20/22 0215 07/20/22 0222 07/20/22 0230 07/20/22 0231  BP: (!) 90/55  98/66 (!) 111/54  Pulse: (!) 113  (!) 107 (!) 105  Resp: (!) 24  (!) 25 19  Temp:      TempSrc:      SpO2: 96%  92% 97%  Weight:      Height:      PainSc:  10-Worst pain ever      Isolation Precautions No active isolations  Medications Medications  lactated ringers infusion ( Intravenous New Bag/Given 07/20/22 0034)  sodium chloride 0.9 % bolus 1,000 mL (0 mLs Intravenous Stopped 07/20/22 0018)    And  sodium chloride 0.9 % bolus 1,000 mL (0 mLs Intravenous Stopped 07/20/22 0127)    And  sodium chloride 0.9 % bolus 500 mL (0 mLs Intravenous Stopped 07/20/22 0109)  vancomycin (VANCOCIN) IVPB 1000 mg/200 mL premix (0 mg Intravenous Stopped 07/20/22 0048)  cefTRIAXone (ROCEPHIN) 2 g in sodium chloride 0.9 % 100 mL IVPB (0 g Intravenous Stopped 07/20/22 0009)  HYDROmorphone (DILAUDID) injection 1 mg (1 mg Intravenous Given 07/20/22 0025)  acetaminophen (TYLENOL) tablet 1,000 mg (1,000 mg Oral Given 07/20/22 0024)  ondansetron (ZOFRAN) injection 4 mg (4 mg Intravenous Given 07/20/22 0024)  LORazepam (ATIVAN) injection 1 mg (1 mg Intravenous  Given 07/20/22 0028)  HYDROmorphone (DILAUDID) injection 1 mg (1 mg Intravenous Given 07/20/22 0139)  HYDROmorphone (DILAUDID) injection 1 mg (1 mg Intravenous Given 07/20/22 0236)    Mobility walks with person assist     Focused Assessments Cardiac Assessment Handoff:    No results found for: "CKTOTAL", "CKMB", "CKMBINDEX", "TROPONINI" No results found for: "DDIMER" Does the Patient currently have chest pain? No   , Neuro Assessment Handoff:  Swallow screen pass?            Neuro Assessment:   Neuro Checks:      Has TPA been given?  If patient is a Neuro Trauma and patient is going to OR before floor call report to [facility] nurse: [phone] or [phone]  , Renal Assessment Handoff:  Hemodialysis Schedule:  Last Hemodialysis date and time:    Restricted appendage:    , Pulmonary Assessment Handoff:  Lung sounds:   O2 Device: Room Air      R Recommendations: See Admitting Provider Note  Report given to:   Additional Notes:

## 2022-07-20 NOTE — Anesthesia Procedure Notes (Signed)
Procedure Name: Intubation Date/Time: 07/20/2022 4:20 PM  Performed by: Montel Clock, CRNAPre-anesthesia Checklist: Patient identified, Emergency Drugs available, Suction available, Patient being monitored and Timeout performed Patient Re-evaluated:Patient Re-evaluated prior to induction Oxygen Delivery Method: Circle system utilized Preoxygenation: Pre-oxygenation with 100% oxygen Induction Type: IV induction, Rapid sequence and Cricoid Pressure applied Laryngoscope Size: Mac and 3 Grade View: Grade II Tube type: Oral Tube size: 7.5 mm Number of attempts: 1 Airway Equipment and Method: Stylet Placement Confirmation: ETT inserted through vocal cords under direct vision, positive ETCO2 and breath sounds checked- equal and bilateral Secured at: 23 cm Tube secured with: Tape Dental Injury: Teeth and Oropharynx as per pre-operative assessment

## 2022-07-20 NOTE — Brief Op Note (Signed)
   07/20/2022  6:12 PM  PATIENT:  Johnathan Brown  47 y.o. male  PRE-OPERATIVE DIAGNOSIS:  LEFT KNEE SEPTIC BURSITIS POSSIBLE SEPTIC ARTHRITIS  POST-OPERATIVE DIAGNOSIS:  LEFT KNEE SEPTIC BURSITIS POSSIBLE SEPTIC ARTHRITIS  PROCEDURE:  Procedure(s): LEFT KNEE PERIPATAR BURSA IRRIGATION AND DEBRIDEMENT POSSIBLE ARTHROSCOPIC KNEE WASHOUT  SURGEON:  Surgeon(s): Meredith Pel, MD  ASSISTANT: magnant pa Watkins ms 3  ANESTHESIA:   general  EBL: 100 ml    Total I/O In: 1300 [I.V.:1000; IV Piggyback:300] Out: 100 [Blood:100]  BLOOD ADMINISTERED: none  DRAINS: (left) Hemovact drain(s) in the bursa with  Suction Open   LOCAL MEDICATIONS USED:  none  SPECIMEN:  cultures x 2  COUNTS:  YES  TOURNIQUET:  * Missing tourniquet times found for documented tourniquets in log: [id number] *  DICTATION: .Other Dictation: Dictation Number [id number]  PLAN OF CARE: Admit to inpatient   PATIENT DISPOSITION:  PACU - hemodynamically stable

## 2022-07-20 NOTE — ED Notes (Signed)
Patient is on cardiac monitoring now

## 2022-07-20 NOTE — Anesthesia Preprocedure Evaluation (Signed)
Anesthesia Evaluation  Patient identified by MRN, date of birth, ID band Patient awake    Reviewed: Allergy & Precautions, NPO status , Patient's Chart, lab work & pertinent test results  History of Anesthesia Complications Negative for: history of anesthetic complications  Airway Mallampati: IV  TM Distance: >3 FB Neck ROM: Full  Mouth opening: Limited Mouth Opening  Dental  (+) Teeth Intact, Dental Advisory Given   Pulmonary neg shortness of breath, neg COPD, neg recent URI, Current Smoker and Patient abstained from smoking.   breath sounds clear to auscultation       Cardiovascular negative cardio ROS  Rhythm:Regular     Neuro/Psych negative neurological ROS     GI/Hepatic ,GERD  ,,(+)     substance abuse  cocaine use and IV drug use  Endo/Other    Renal/GU negative Renal ROS     Musculoskeletal  (+)  narcotic dependentLEFT KNEE SEPTIC BURSITIS POSSIBLE SEPTIC ARTHRITIS   Abdominal   Peds  Hematology  (+) Blood dyscrasia Lab Results      Component                Value               Date                      WBC                      20.3 (H)            07/20/2022                HGB                      12.3 (L)            07/20/2022                HCT                      38.1 (L)            07/20/2022                MCV                      88.8                07/20/2022                PLT                      160                 07/20/2022              Anesthesia Other Findings   Reproductive/Obstetrics                             Anesthesia Physical Anesthesia Plan  ASA: 4 and emergent  Anesthesia Plan: General   Post-op Pain Management: Tylenol PO (pre-op)*, Toradol IV (intra-op)*, Ketamine IV* and Precedex   Induction: Intravenous, Rapid sequence and Cricoid pressure planned  PONV Risk Score and Plan: 1 and Ondansetron and Dexamethasone  Airway Management Planned: Oral  ETT  Additional Equipment:   Intra-op Plan:   Post-operative Plan: Extubation in OR and Possible  Post-op intubation/ventilation  Informed Consent: I have reviewed the patients History and Physical, chart, labs and discussed the procedure including the risks, benefits and alternatives for the proposed anesthesia with the patient or authorized representative who has indicated his/her understanding and acceptance.     Dental advisory given  Plan Discussed with: CRNA  Anesthesia Plan Comments:        Anesthesia Quick Evaluation

## 2022-07-20 NOTE — H&P (Signed)
History and Physical    Johnathan Brown [id number] DOB: 04-Jan-1976 DOA: 07/19/2022  PCP: Patient, No Pcp Per  Patient coming from: Home  Chief Complaint: Left knee pain  HPI: Johnathan Brown is a 47 y.o. male with medical history significant of IV drug abuse/heroin use, tobacco abuse, GERD.  Earlier this month on 3/2, he was admitted to [facility] for left knee cellulitis and left AMA.  He returned to the ED on 3/6 and was given a dose of vancomycin and discharged with prescription for doxycycline as he declined hospital admission.  He returns to the ED today for evaluation of worsening infection of his left knee.  In the ED, patient febrile with temperature 104 F and tachycardic up to 140s.  Blood pressure low with systolic in the 0000000.  Labs significant for WBC 20.9, sodium 133, lactic acid 2.4> 2.0, blood cultures drawn.  X-ray of the left knee showing soft tissue swelling anterior to the patella and patellar ligament without significant effusion and no acute bony abnormalities.  Patient received Tylenol, Dilaudid, Ativan, Zofran, vancomycin, ceftriaxone, and 2.5 L IV fluid boluses.  TRH called to admit.  Patient currently very somnolent.  He is able to open his eyes but falls asleep quickly and is not able to give any history.  His daughter is at bedside who is a Electronics engineer.  Daughter states earlier this month patient was admitted to [facility] for infection of his left knee and then had another ED visit a few days later and was prescribed doxycycline which he has finished taking but his left knee infection has not improved.  He has been complaining of a lot of pain in this knee.  Daughter states patient uses IV drugs but she is not sure which drugs exactly.  Review of Systems:  Review of Systems  Reason unable to perform ROS: Patient somnolent.    Past Medical History:  Diagnosis Date   Abscess 01/2015   GERD (gastroesophageal reflux disease)     Past Surgical History:  Procedure  Laterality Date   I & D EXTREMITY Right 01/25/2015   Procedure: MINOR IRRIGATION AND DEBRIDEMENT RIGHT ARM;  Surgeon: Newt Minion, MD;  Location: [place];  Service: Orthopedics;  Laterality: Right;   INNER EAR SURGERY     x 5      reports that he has been smoking cigarettes. He has a 7.50 pack-year smoking history. He has never used smokeless tobacco. He reports that he does not currently use drugs. He reports that he does not drink alcohol.  No Known Allergies  Family History  Problem Relation Age of Onset   Hypertension Father    Diabetes Father    Crohn's disease Father     Prior to Admission medications   Medication Sig Start Date End Date Taking? Authorizing Provider  amphetamine-dextroamphetamine (ADDERALL) 20 MG tablet Take 20 mg by mouth 2 (two) times daily.   Yes [provider]  buprenorphine (SUBUTEX) 8 MG SUBL SL tablet Place 8 mg under the tongue daily.   Yes [provider]  doxycycline (VIBRAMYCIN) 100 MG capsule Take 1 capsule (100 mg total) by mouth 2 (two) times daily. Patient not taking: Reported on 07/19/2022 06/27/22   Valarie Merino, MD    Physical Exam: Vitals:   07/20/22 0200 07/20/22 0215 07/20/22 0230 07/20/22 0231  BP: 112/68 (!) 90/55 98/66 (!) 111/54  Pulse: (!) 117 (!) 113 (!) 107 (!) 105  Resp: (!) 23 (!) 24 (!) 25  19  Temp:      TempSrc:      SpO2: 90% 96% 92% 97%  Weight:      Height:        Physical Exam Vitals reviewed.  Constitutional:      General: He is not in acute distress. HENT:     Head: Normocephalic and atraumatic.  Eyes:     Extraocular Movements: Extraocular movements intact.  Cardiovascular:     Rate and Rhythm: Normal rate and regular rhythm.     Pulses: Normal pulses.  Pulmonary:     Effort: Pulmonary effort is normal. No respiratory distress.     Breath sounds: Normal breath sounds. No wheezing or rales.  Abdominal:     General: Bowel sounds are normal. There is no distension.     Palpations:  Abdomen is soft.     Tenderness: There is no abdominal tenderness.  Musculoskeletal:     Cervical back: Normal range of motion.     Comments: Left knee erythematous, swollen, and warm to touch.  Skin:    General: Skin is warm and dry.     Comments: Needle track marks on the left forearm  Neurological:     Mental Status: He is alert.     Comments: Somnolent but arousable            Labs on Admission: I have personally reviewed following labs and imaging studies  CBC: Recent Labs  Lab 07/19/22 2243  WBC 20.9*  NEUTROABS 18.3*  HGB 14.3  HCT 44.2  MCV 88.4  PLT 99991111   Basic Metabolic Panel: Recent Labs  Lab 07/19/22 2243  NA 133*  K 3.9  CL 97*  CO2 25  GLUCOSE 107*  BUN 19  CREATININE 1.06  CALCIUM 8.7*   GFR: Estimated Creatinine Clearance: 90.9 mL/min (by C-G formula based on SCr of 1.06 mg/dL). Liver Function Tests: Recent Labs  Lab 07/19/22 2243  AST 23  ALT 16  ALKPHOS 96  BILITOT 0.9  PROT 8.2*  ALBUMIN 4.0   No results for input(s): "LIPASE", "AMYLASE" in the last 168 hours. No results for input(s): "AMMONIA" in the last 168 hours. Coagulation Profile: Recent Labs  Lab 07/19/22 2243  INR 1.1   Cardiac Enzymes: No results for input(s): "CKTOTAL", "CKMB", "CKMBINDEX", "TROPONINI" in the last 168 hours. BNP (last 3 results) No results for input(s): "PROBNP" in the last 8760 hours. HbA1C: No results for input(s): "HGBA1C" in the last 72 hours. CBG: No results for input(s): "GLUCAP" in the last 168 hours. Lipid Profile: No results for input(s): "CHOL", "HDL", "LDLCALC", "TRIG", "CHOLHDL", "LDLDIRECT" in the last 72 hours. Thyroid Function Tests: No results for input(s): "TSH", "T4TOTAL", "FREET4", "T3FREE", "THYROIDAB" in the last 72 hours. Anemia Panel: No results for input(s): "VITAMINB12", "FOLATE", "FERRITIN", "TIBC", "IRON", "RETICCTPCT" in the last 72 hours. Urine analysis:    Component Value Date/Time   COLORURINE YELLOW  01/25/2015 1045   APPEARANCEUR CLEAR 01/25/2015 1045   LABSPEC 1.019 01/25/2015 1045   PHURINE 6.0 01/25/2015 1045   GLUCOSEU NEGATIVE 01/25/2015 1045   HGBUR NEGATIVE 01/25/2015 [place] 01/25/2015 1045   KETONESUR NEGATIVE 01/25/2015 1045   PROTEINUR NEGATIVE 01/25/2015 1045   UROBILINOGEN 1.0 01/25/2015 1045   NITRITE NEGATIVE 01/25/2015 1045   LEUKOCYTESUR NEGATIVE 01/25/2015 1045    Radiological Exams on Admission: DG Knee Complete 4 Views Left  Result Date: 07/19/2022 CLINICAL DATA:  Septic joint.  Left knee pain and infection EXAM: LEFT KNEE -  COMPLETE 4+ VIEW COMPARISON:  06/27/2022 FINDINGS: No evidence of acute fracture or dislocation of the left knee. No focal bone lesion or bone destruction. No cortical erosion or sclerosis to suggest evidence of osteomyelitis. Joint spaces are normal. No significant effusion. Subcutaneous soft tissue swelling anterior to the patella and patellar ligament similar to prior study. No soft tissue gas or radiopaque foreign body demonstrated. IMPRESSION: 1. Soft tissue swelling anterior to the patella and patellar ligament. No significant effusion. 2. No acute bony abnormalities. Electronically Signed   By: Lucienne Capers M.D.   On: 07/19/2022 23:50    EKG: Independently reviewed.  Sinus tachycardia.  Assessment and Plan  Severe sepsis secondary to left knee infection/concern for septic arthritis He has failed outpatient antibiotic therapy.  Meets criteria for severe sepsis with fever, tachycardia, low blood pressure, leukocytosis, and lactic acidosis. X-ray of the left knee showing soft tissue swelling anterior to the patella and patellar ligament without significant effusion and no acute bony abnormalities.  He received vancomycin, ceftriaxone, and 2.5 L IV fluid boluses in the ED.  Tachycardia and blood pressure improved.  Lactic acid improved on repeat labs.  Continue antibiotics, IV fluids, and pain management..  Trend WBC  count.  Blood cultures pending.  Keep n.p.o. and consult orthopedics in the morning for joint aspiration.  Mild hyponatremia Continue IV fluid hydration and monitor labs.  Substance abuse Patient with active IV heroin abuse.  UDS ordered.  Withdrawal protocol as needed.  DVT prophylaxis: SCDs Code Status: Full Code (discussed with the patient's daughter) Family Communication: Daughter at bedside. Level of care: Progressive Care Unit Admission status: It is my clinical opinion that admission to INPATIENT is reasonable and necessary because of the expectation that this patient will require hospital care that crosses at least 2 midnights to treat this condition based on the medical complexity of the problems presented.  Given the aforementioned information, the predictability of an adverse outcome is felt to be significant.   Shela Leff MD [facility]  If 7PM-7AM, please contact night-coverage [email or web address]  07/20/2022, 2:57 AM

## 2022-07-20 NOTE — Consult Note (Signed)
[facility] for Infectious Disease    Date of Admission:  07/19/2022     Reason for Consult: Left knee septic joint     Referring Physician: Dr Grandville Silos  Current antibiotics: Vancomycin Ceftriaxone  ASSESSMENT:    47 y.o. male admitted with:  Left knee infection and suspected septic arthritis: Patient presenting with worsening left knee pain, swelling, and erythema in the setting of ongoing cellulitis for the past several weeks with previous AMA discharges and ED visits.  His sepsis presentation concerning for seeding the blood stream.   Injection drug use. Severe sepsis: Due to #1.   RECOMMENDATIONS:    Continue vancomycin  Change ceftriaxone to cefazolin Orthopedics consult pending.  Anticipate will need surgical intervention Follow cultures Will check RPR, Hepatitis serology, and HIV Anticipating he will be bacteremic presumably with Staph for strep Following.  Dr Baxter Flattery here this weekend.   Principal Problem:   Infection of left knee (HCC) Active Problems:   Substance abuse ([place])   Hyponatremia   MEDICATIONS:    Scheduled Meds: Continuous Infusions:  cefTRIAXone (ROCEPHIN)  IV     lactated ringers 150 mL/hr at 07/20/22 [id number]   vancomycin     PRN Meds:.acetaminophen **OR** acetaminophen, dicyclomine, HYDROmorphone (DILAUDID) injection, hydrOXYzine, loperamide, methocarbamol, ondansetron  HPI:    Johnathan Brown is a 47 y.o. male with PMHx of substance use disorder and injection drug use presenting to [facility] last night with worsening left knee pain, swelling, erythema, and fevers.   Pt was admitted earlier this month at [facility] with left knee cellulitis but left AMA.  Returned to the ED 3/6 and given dose of vancomycin and discharged with Rx for doxycycline as he declined admission.  Returns yesterday with worsening symptoms as above.  Found to be septic with fever, leukocytosis, tachycardic.  He was started on antibiotics and blood cultures were done.   Orthopedics consult is pending.     Past Medical History:  Diagnosis Date   Abscess 01/2015   GERD (gastroesophageal reflux disease)     Social History   Tobacco Use   Smoking status: Every Day    Packs/day: 0.50    Years: 15.00    Additional pack years: 0.00    Total pack years: 7.50    Types: Cigarettes   Smokeless tobacco: Never  Vaping Use   Vaping Use: Some days   Substances: Nicotine, Flavoring  Substance Use Topics   Alcohol use: No   Drug use: Not Currently    Family History  Problem Relation Age of Onset   Hypertension Father    Diabetes Father    Crohn's disease Father     No Known Allergies  Review of Systems  All other systems reviewed and are negative.  Except as noted above.   OBJECTIVE:   Blood pressure (!) 110/58, pulse (!) 103, temperature 97.8 F (36.6 C), temperature source Oral, resp. rate 20, height 5\' 10"  (1.778 m), weight 76.7 kg, SpO2 94 %. Body mass index is 24.25 kg/m.  Physical Exam Constitutional:      Comments: Ill appearing, uncomfortable in bed.  Daughter in room.   HENT:     Head: Normocephalic and atraumatic.  Eyes:     Extraocular Movements: Extraocular movements intact.     Conjunctiva/sclera: Conjunctivae normal.  Cardiovascular:     Rate and Rhythm: Normal rate and regular rhythm.  Pulmonary:     Effort: Pulmonary effort is normal. No respiratory distress.     Breath sounds: Normal  breath sounds.  Abdominal:     General: There is no distension.     Palpations: Abdomen is soft.     Tenderness: There is no abdominal tenderness.  Musculoskeletal:        General: Swelling and tenderness present.     Cervical back: Normal range of motion and neck supple.     Comments: Left knee swollen, tender, erythematous.  Streaking up thigh noted.   Skin:    General: Skin is warm and dry.     Findings: Erythema present.  Neurological:     General: No focal deficit present.     Mental Status: He is oriented to person,  place, and time.  Psychiatric:        Mood and Affect: Mood normal.        Behavior: Behavior normal.      Lab Results: Lab Results  Component Value Date   WBC 20.3 (H) 07/20/2022   HGB 12.3 (L) 07/20/2022   HCT 38.1 (L) 07/20/2022   MCV 88.8 07/20/2022   PLT 160 07/20/2022    Lab Results  Component Value Date   NA 130 (L) 07/20/2022   K 3.8 07/20/2022   CO2 22 07/20/2022   GLUCOSE 132 (H) 07/20/2022   BUN 15 07/20/2022   CREATININE 0.98 07/20/2022   CALCIUM 7.5 (L) 07/20/2022   GFRNONAA >60 07/20/2022   GFRAA >60 09/08/2018    Lab Results  Component Value Date   ALT 16 07/19/2022   AST 23 07/19/2022   ALKPHOS 96 07/19/2022   BILITOT 0.9 07/19/2022    No results found for: "CRP"     Component Value Date/Time   ESRSEDRATE 25 (H) 06/23/2022 1144    I have reviewed the micro and lab results in Epic.  Imaging: DG Knee Complete 4 Views Left  Result Date: 07/19/2022 CLINICAL DATA:  Septic joint.  Left knee pain and infection EXAM: LEFT KNEE - COMPLETE 4+ VIEW COMPARISON:  06/27/2022 FINDINGS: No evidence of acute fracture or dislocation of the left knee. No focal bone lesion or bone destruction. No cortical erosion or sclerosis to suggest evidence of osteomyelitis. Joint spaces are normal. No significant effusion. Subcutaneous soft tissue swelling anterior to the patella and patellar ligament similar to prior study. No soft tissue gas or radiopaque foreign body demonstrated. IMPRESSION: 1. Soft tissue swelling anterior to the patella and patellar ligament. No significant effusion. 2. No acute bony abnormalities. Electronically Signed   By: Lucienne Capers M.D.   On: 07/19/2022 23:50     Imaging independently reviewed in Epic.  [facility] [phone] pager 07/20/2022, 9:34 AM

## 2022-07-20 NOTE — ED Notes (Signed)
PA Will at the bedside to advised on medication to be administer such as Tylenol, Ativan, Dilaudid, and Zofran as pt still refusing Tylenol.

## 2022-07-20 NOTE — Transfer of Care (Signed)
Immediate Anesthesia Transfer of Care Note  Patient: Johnathan Brown  Procedure(s) Performed: LEFT KNEE PERIPATAR BURSA IRRIGATION AND DEBRIDEMENT POSSIBLE ARTHROSCOPIC KNEE WASHOUT (Left: Knee)  Patient Location: PACU  Anesthesia Type:General  Level of Consciousness: drowsy and patient cooperative  Airway & Oxygen Therapy: Patient Spontanous Breathing and Patient connected to face mask oxygen  Post-op Assessment: Report given to RN and Post -op Vital signs reviewed and stable  Post vital signs: Reviewed and stable  Last Vitals:  Vitals Value Taken Time  BP 114/76 07/20/22 1800  Temp    Pulse 95 07/20/22 1805  Resp 28 07/20/22 1805  SpO2 100 % 07/20/22 1805  Vitals shown include unvalidated device data.  Last Pain:  Vitals:   07/20/22 1221  TempSrc:   PainSc: 8          Complications: No notable events documented.

## 2022-07-21 DIAGNOSIS — M71162 Other infective bursitis, left knee: Secondary | ICD-10-CM

## 2022-07-21 DIAGNOSIS — E871 Hypo-osmolality and hyponatremia: Secondary | ICD-10-CM | POA: Diagnosis not present

## 2022-07-21 DIAGNOSIS — A419 Sepsis, unspecified organism: Secondary | ICD-10-CM | POA: Diagnosis not present

## 2022-07-21 DIAGNOSIS — F1910 Other psychoactive substance abuse, uncomplicated: Secondary | ICD-10-CM | POA: Diagnosis not present

## 2022-07-21 LAB — HEPATITIS B CORE ANTIBODY, TOTAL: Hep B Core Total Ab: NONREACTIVE

## 2022-07-21 LAB — CBC WITH DIFFERENTIAL/PLATELET
Abs Immature Granulocytes: 0.08 10*3/uL — ABNORMAL HIGH (ref 0.00–0.07)
Basophils Absolute: 0 10*3/uL (ref 0.0–0.1)
Basophils Relative: 0 %
Eosinophils Absolute: 0 10*3/uL (ref 0.0–0.5)
Eosinophils Relative: 0 %
HCT: 38 % — ABNORMAL LOW (ref 39.0–52.0)
Hemoglobin: 12 g/dL — ABNORMAL LOW (ref 13.0–17.0)
Immature Granulocytes: 1 %
Lymphocytes Relative: 5 %
Lymphs Abs: 0.9 10*3/uL (ref 0.7–4.0)
MCH: 28.4 pg (ref 26.0–34.0)
MCHC: 31.6 g/dL (ref 30.0–36.0)
MCV: 89.8 fL (ref 80.0–100.0)
Monocytes Absolute: 0.8 10*3/uL (ref 0.1–1.0)
Monocytes Relative: 5 %
Neutro Abs: 14.8 10*3/uL — ABNORMAL HIGH (ref 1.7–7.7)
Neutrophils Relative %: 89 %
Platelets: 167 10*3/uL (ref 150–400)
RBC: 4.23 MIL/uL (ref 4.22–5.81)
RDW: 14.2 % (ref 11.5–15.5)
WBC: 16.6 10*3/uL — ABNORMAL HIGH (ref 4.0–10.5)
nRBC: 0 % (ref 0.0–0.2)

## 2022-07-21 LAB — HEPATITIS B SURFACE ANTIBODY,QUALITATIVE: Hep B S Ab: NONREACTIVE

## 2022-07-21 LAB — BASIC METABOLIC PANEL
Anion gap: 9 (ref 5–15)
BUN: 16 mg/dL (ref 6–20)
CO2: 24 mmol/L (ref 22–32)
Calcium: 8 mg/dL — ABNORMAL LOW (ref 8.9–10.3)
Chloride: 102 mmol/L (ref 98–111)
Creatinine, Ser: 0.74 mg/dL (ref 0.61–1.24)
GFR, Estimated: >60 mL/min (ref >60)
Glucose, Bld: 148 mg/dL — ABNORMAL HIGH (ref 70–99)
Potassium: 3.5 mmol/L (ref 3.5–5.1)
Sodium: 135 mmol/L (ref 135–145)

## 2022-07-21 LAB — MAGNESIUM: Magnesium: 2.2 mg/dL (ref 1.7–2.4)

## 2022-07-21 LAB — HEPATITIS B SURFACE ANTIGEN: Hepatitis B Surface Ag: NONREACTIVE

## 2022-07-21 LAB — HEPATITIS C ANTIBODY: HCV Ab: REACTIVE — AB

## 2022-07-21 LAB — HIV ANTIBODY (ROUTINE TESTING W REFLEX): HIV Screen 4th Generation wRfx: NONREACTIVE

## 2022-07-21 LAB — RPR: RPR Ser Ql: NONREACTIVE

## 2022-07-21 MED ORDER — VANCOMYCIN HCL 1500 MG/300ML IV SOLN
1500.0000 mg | Freq: Two times a day (BID) | INTRAVENOUS | Status: DC
  Administered 2022-07-21: 1500 mg via INTRAVENOUS
  Filled 2022-07-21 (×2): qty 300

## 2022-07-21 MED ORDER — VANCOMYCIN HCL 500 MG/100ML IV SOLN
500.0000 mg | Freq: Once | INTRAVENOUS | Status: AC
  Administered 2022-07-21: 500 mg via INTRAVENOUS
  Filled 2022-07-21: qty 100

## 2022-07-21 MED ORDER — ASPIRIN 81 MG PO CHEW
81.0000 mg | CHEWABLE_TABLET | Freq: Two times a day (BID) | ORAL | Status: DC
  Administered 2022-07-21 (×2): 81 mg via ORAL
  Filled 2022-07-21 (×2): qty 1

## 2022-07-21 MED ORDER — SODIUM CHLORIDE 0.9 % IV SOLN
INTRAVENOUS | Status: DC
  Administered 2022-07-21: 125 mL via INTRAVENOUS

## 2022-07-21 MED ORDER — HYDROMORPHONE HCL 1 MG/ML IJ SOLN
1.0000 mg | INTRAMUSCULAR | Status: DC | PRN
  Administered 2022-07-21 (×4): 1 mg via INTRAVENOUS
  Filled 2022-07-21 (×5): qty 1

## 2022-07-21 NOTE — Progress Notes (Signed)
  Subjective: Patient is POD1 s/p left knee prepatellar bursa excision with I&D.  He is sleepy this morning.  No complaints aside from knee pain. No other joints are causing him discomfort.  Intermittent fevers and chills as well as night sweats.  No current constitutional symptoms on evaluation this morning.     Objective: Vital signs in last 24 hours: Temp:  [97.8 F (36.6 C)-101.2 F (38.4 C)] 98 F (36.7 C) (03/30 0639) Pulse Rate:  [59-109] 59 (03/30 0639) Resp:  [20-29] 20 (03/30 0639) BP: (103-125)/(58-89) 103/81 (03/30 0639) SpO2:  [94 %-100 %] 100 % (03/30 0639) Weight:  [77.1 kg] 77.1 kg (03/29 1500)  Intake/Output from previous day: 03/29 0701 - 03/30 0700 In: 2145.7 [P.O.:400; I.V.:1000; IV Piggyback:745.7] Out: 2700 [Urine:2600; Blood:100] Intake/Output this shift: No intake/output data recorded.  Exam:  Intact pulses distally Dorsiflexion/Plantar flexion intact Compartment soft Erythema noted in the distal shin, not extending past the borders previously marked. No gross blood or drainage.  Small amount of blood in the hemovac drain.    Labs: Recent Labs    07/19/22 2243 07/20/22 0525  HGB 14.3 12.3*   Recent Labs    07/19/22 2243 07/20/22 0525  WBC 20.9* 20.3*  RBC 5.00 4.29  HCT 44.2 38.1*  PLT 180 160   Recent Labs    07/19/22 2243 07/20/22 0525  NA 133* 130*  K 3.9 3.8  CL 97* 99  CO2 25 22  BUN 19 15  CREATININE 1.06 0.98  GLUCOSE 107* 132*  CALCIUM 8.7* 7.5*   Recent Labs    07/19/22 2243  INR 1.1    Assessment/Plan: Plan is continue IV abx per ID.  Okay to WBAT in knee immobilizer. No knee flexion yet.  Aspirin/SCDs for DVT prophylaxis. Drain pulled this morning without incident.    Annie Main 07/21/2022, 8:37 AM

## 2022-07-21 NOTE — Progress Notes (Addendum)
ID PROGRESS NOTE  47yo M with left knee pre patellar septic bursitis s/p washout by dr dean on 3/29. Cx on gram stain + showing GPC.   He is MSSA colonized  A/P: septic bursitis of left knee - continue on vancomycin and cefazolin for the time being and narrow as micro results return.  Elzie Rings [facility] Infectious Diseases [phone]

## 2022-07-21 NOTE — Progress Notes (Signed)
Pharmacy Antibiotic Note  Johnathan Brown is a 47 y.o. male admitted on 07/19/2022 with left knee prepatellar septic bursitis.  Pharmacy has been consulted for Vancomycin dosing.  Plan: Continue Cefazolin 2g IV q8h Increase to Vancomycin 1500 mg IV q12h  (SCr 0.8, est AUC 503) Measure Vanc levels as needed.  Goal AUC = 400 - 550 Follow up renal function, culture results, and clinical course.  Height: 5\' 11"  (180.3 cm) Weight: 77.1 kg (170 lb) IBW/kg (Calculated) : 75.3  Temp (24hrs), Avg:98.7 F (37.1 C), Min:97.6 F (36.4 C), Max:101.2 F (38.4 C)  Recent Labs  Lab 07/19/22 2243 07/19/22 2358 07/20/22 0132 07/20/22 0525 07/21/22 0920  WBC 20.9*  --   --  20.3* 16.6*  CREATININE 1.06  --   --  0.98 0.74  LATICACIDVEN  --  2.4* 2.0*  --   --      Estimated Creatinine Clearance: 124.2 mL/min (by C-G formula based on SCr of 0.74 mg/dL).    No Known Allergies  Antimicrobials this admission: 3/28 Rocephin >> 3/29 3/28 Vancomycin >>  3/29 Cefazolin >>   Dose adjustments this admission: 3/30 empiric renal dose adjustment  Microbiology results: 3/28 BCx x2: ngtd 3/29 tissue prepatella bursa: few GPC  Thank you for allowing pharmacy to be a part of this patient's care.  Gretta Arab PharmD, BCPS [facility] main pharmacy [phone] 07/21/2022 11:05 AM

## 2022-07-21 NOTE — Progress Notes (Signed)
PROGRESS NOTE    Johnathan Brown  [id number] DOB: 1975-05-29 DOA: 07/19/2022 PCP: Patient, No Pcp Per    Chief Complaint  Patient presents with   Joint Swelling    Brief Narrative:  Patient is a 47 year old gentleman history of polysubstance abuse with IV drug use of heroin, cocaine, methamphetamines, tobacco abuse, GERD.  Patient admitted at [facility] on 06/23/2022 for left knee cellulitis however left AMA.  Patient returned back to the ED on 06/27/2022 received a dose of IV vancomycin, discharge with a prescription for doxycycline as patient declined hospital admission at that time.  Patient took 5 days worth of doxycycline presented back to the ED for worsening infection of the left knee.  Patient seen in the ED noted to be septic with fever with a temp of 104, tachycardic, tachypneic, soft/low blood pressure, leukocytosis, lactic acidosis.  Plain films of the left knee done with soft tissue swelling anterior to the patella and patellar ligament without significant effusion, no acute bone abnormalities.  Patient placed empirically on IV vancomycin, IV Rocephin, IV fluids.  ID consulted.  Orthopedic consulted.   Assessment & Plan:   Principal Problem:   Severe sepsis ([place]) Active Problems:   Infection of left knee (HCC)   Sepsis without acute organ dysfunction (HCC)   Substance abuse (HCC)   Hyponatremia   Septic prepatellar bursitis of left knee  #1 severe sepsis secondary to left knee infection/concern for septic arthritis/septic prepatellar bursitis of left knee status post left knee excisional debridement of the septic prepatellar bursa with closure over Hemovac drain per Dr. Marlou Sa 07/20/2022 -Patient initially presented to [facility] on 06/23/2022 with left knee cellulitis left AMA.  Returned back to the ED 06/27/2022 received dose of IV vancomycin discharged on doxycycline as patient refused admission at that time.  Patient took 5 days worth of doxycycline presented back to the ED with worsening  left knee pain, swelling, concern for infection. -Patient met criteria for severe sepsis with a lactic acidosis, fever, tachycardia, soft/borderline blood pressure, leukocytosis, lactic acidosis. -Plan films of the left knee with soft tissue swelling anterior to the patella and patellar ligament without significant effusion, no acute bone abnormalities. -Blood cultures ordered and pending with no growth to date. -Patient seen in consultation by ID who have ordered RPR which was nonreactive, HIV nonreactive, hep B surface antigen nonreactive, hep B surface antibody nonreactive, hep B core antibody nonreactive, hep C antibody reactive. -Patient seen in consultation by orthopedics and patient noted to have a septic prepatellar bursitis of the left knee and underwent left knee excisional debridement of the septic prepatellar bursa with closure over Hemovac drain and cultures obtained currently pending. -Continue current IV pain regimen and oral pain regimen.  -IV antiemetics, supportive care. -Continue IV vancomycin.  IV Rocephin changed to IV cefazolin.   -ID and orthopedics following and appreciate input and recommendations.  2.  Mild hyponatremia -Likely secondary to dehydration. -Improved with IV fluids.  3.  Polysubstance abuse -Patient with polysubstance abuse including active IV heroin abuse, cocaine use, methamphetamines. -UDS done positive for amphetamines, benzos, opiates, cocaine. -Cessation stressed to patient. -Continue withdrawal protocol as needed. -TOC consultation placed.      DVT prophylaxis: SCDs Code Status: Full Family Communication: Updated patient, aunt at bedside.  Disposition: TBD, likely home with home health when clinically improved, cleared by ID and orthopedics.  Status is: Inpatient Remains inpatient appropriate because: Severity of illness.   Consultants:  ID: Dr. Juleen China 07/20/2022 Orthopedics: Dr. Marlou Sa 07/20/2022  Procedures:  Left knee plain films  07/19/2022 Left knee excisional debridement of the septic prepatellar bursa with closure over Hemovac drain per orthopedics: Dr. Marlou Sa 07/20/2022  Antimicrobials:  IV Rocephin 07/19/2022>>> 07/20/2022 IV Ancef 07/20/2022 >>>>>> IV vancomycin 07/19/2022>>>>   Subjective: Laying in bed.  Feeling a little bit better overall.  Complaining of some congestion in his chest.  No abdominal pain.  Still with some left knee pain.  Aunt at bedside.  Tolerated breakfast this morning.  Patient seen by orthopedics this morning and drain removed.  Objective: Vitals:   07/21/22 0244 07/21/22 0639 07/21/22 0639 07/21/22 1034  BP: 110/81 103/81 103/81 115/83  Pulse: 64 60 (!) 59 93  Resp: 20 20 20 20   Temp:  98 F (36.7 C) 98 F (36.7 C) 97.6 F (36.4 C)  TempSrc:    Oral  SpO2: 99% 100% 100% 100%  Weight:      Height:        Intake/Output Summary (Last 24 hours) at 07/21/2022 1058 Last data filed at 07/20/2022 2248 Gross per 24 hour  Intake 2145.68 ml  Output 2700 ml  Net -554.32 ml    Filed Weights   07/19/22 2211 07/20/22 1500  Weight: 76.7 kg 77.1 kg    Examination:  General exam: NAD Respiratory system: Clear to auscultation bilaterally.  No wheezes, no crackles, no rhonchi.  Fair air movement.  Speaking in full sentences.  Normal respiratory effort.   Cardiovascular system: RRR no murmurs rubs or gallops.  No JVD.  No lower extremity edema.  Gastrointestinal system: Abdomen is soft, nontender, nondistended, positive bowel sounds.  No rebound.  No guarding.  Central nervous system: Alert and oriented.  Moving extremities spontaneously.  No focal neurological deficits. Extremities: Left lower extremity in knee immobilizer.  Postop bandage in place. Skin: No rashes, lesions or ulcers Psychiatry: Judgement and insight appear fair. Mood & affect appropriate.     Data Reviewed: I have personally reviewed following labs and imaging studies  CBC: Recent Labs  Lab 07/19/22 2243  07/20/22 0525 07/21/22 0920  WBC 20.9* 20.3* 16.6*  NEUTROABS 18.3*  --  14.8*  HGB 14.3 12.3* 12.0*  HCT 44.2 38.1* 38.0*  MCV 88.4 88.8 89.8  PLT 180 160 167     Basic Metabolic Panel: Recent Labs  Lab 07/19/22 2243 07/20/22 0525 07/21/22 0920  NA 133* 130* 135  K 3.9 3.8 3.5  CL 97* 99 102  CO2 25 22 24   GLUCOSE 107* 132* 148*  BUN 19 15 16   CREATININE 1.06 0.98 0.74  CALCIUM 8.7* 7.5* 8.0*  MG  --   --  2.2     GFR: Estimated Creatinine Clearance: 124.2 mL/min (by C-G formula based on SCr of 0.74 mg/dL).  Liver Function Tests: Recent Labs  Lab 07/19/22 2243  AST 23  ALT 16  ALKPHOS 96  BILITOT 0.9  PROT 8.2*  ALBUMIN 4.0     CBG: No results for input(s): "GLUCAP" in the last 168 hours.   Recent Results (from the past 240 hour(s))  Blood Culture (routine x 2)     Status: None (Preliminary result)   Collection Time: 07/19/22 10:43 PM   Specimen: BLOOD  Result Value Ref Range Status   Specimen Description   Final    BLOOD BLOOD LEFT FOREARM Performed at [facility] [address], [place], [place] [address]    Special Requests   Final    BOTTLES DRAWN AEROBIC AND ANAEROBIC Blood Culture adequate  volume Performed at [facility], [place] [address], [place], [place] [address]    Culture   Final    NO GROWTH 1 DAY Performed at [facility], [place] [address] [place], [place] [address]    Report Status PENDING  Incomplete  Blood Culture (routine x 2)     Status: None (Preliminary result)   Collection Time: 07/19/22 11:30 PM   Specimen: BLOOD  Result Value Ref Range Status   Specimen Description   Final    BLOOD BLOOD LEFT HAND Performed at [facility] [address] [place], [place] [address]    Special Requests   Final    BOTTLES DRAWN AEROBIC AND ANAEROBIC Blood Culture adequate volume Performed at [facility] [address] [place], [place] [address]     Culture   Final    NO GROWTH 1 DAY Performed at [facility], [place] [address] [place], [place] [address]    Report Status PENDING  Incomplete  Resp panel by RT-PCR (RSV, Flu A&B, Covid) Anterior Nasal Swab     Status: None   Collection Time: 07/20/22  1:06 AM   Specimen: Anterior Nasal Swab  Result Value Ref Range Status   SARS Coronavirus 2 by RT PCR NEGATIVE NEGATIVE Final    Comment: (NOTE) SARS-CoV-2 target nucleic acids are NOT DETECTED.  The SARS-CoV-2 RNA is generally detectable in upper respiratory specimens during the acute phase of infection. The lowest concentration of SARS-CoV-2 viral copies this assay can detect is 138 copies/mL. A negative result does not preclude SARS-Cov-2 infection and should not be used as the sole basis for treatment or other patient management decisions. A negative result may occur with  improper specimen collection/handling, submission of specimen other than nasopharyngeal swab, presence of viral mutation(s) within the areas targeted by this assay, and inadequate number of viral copies(<138 copies/mL). A negative result must be combined with clinical observations, patient history, and epidemiological information. The expected result is Negative.  Fact Sheet for Patients:  EntrepreneurPulse.com.au  Fact Sheet for Healthcare Providers:  IncredibleEmployment.be  This test is no t yet approved or cleared by the [place] FDA and  has been authorized for detection and/or diagnosis of SARS-CoV-2 by FDA under an Emergency Use Authorization (EUA). This EUA will remain  in effect (meaning this test can be used) for the duration of the COVID-19 declaration under Section 564(b)(1) of the Act, 21 U.S.C.section 360bbb-3(b)(1), unless the authorization is terminated  or revoked sooner.       Influenza A by PCR NEGATIVE NEGATIVE Final   Influenza B by PCR NEGATIVE NEGATIVE Final    Comment: (NOTE) The  Xpert Xpress SARS-CoV-2/FLU/RSV plus assay is intended as an aid in the diagnosis of influenza from Nasopharyngeal swab specimens and should not be used as a sole basis for treatment. Nasal washings and aspirates are unacceptable for Xpert Xpress SARS-CoV-2/FLU/RSV testing.  Fact Sheet for Patients: EntrepreneurPulse.com.au  Fact Sheet for Healthcare Providers: IncredibleEmployment.be  This test is not yet approved or cleared by the [place] FDA and has been authorized for detection and/or diagnosis of SARS-CoV-2 by FDA under an Emergency Use Authorization (EUA). This EUA will remain in effect (meaning this test can be used) for the duration of the COVID-19 declaration under Section 564(b)(1) of the Act, 21 U.S.C. section 360bbb-3(b)(1), unless the authorization is terminated or revoked.     Resp Syncytial Virus by PCR NEGATIVE NEGATIVE Final    Comment: (  NOTE) Fact Sheet for Patients: EntrepreneurPulse.com.au  Fact Sheet for Healthcare Providers: IncredibleEmployment.be  This test is not yet approved or cleared by the [place] FDA and has been authorized for detection and/or diagnosis of SARS-CoV-2 by FDA under an Emergency Use Authorization (EUA). This EUA will remain in effect (meaning this test can be used) for the duration of the COVID-19 declaration under Section 564(b)(1) of the Act, 21 U.S.C. section 360bbb-3(b)(1), unless the authorization is terminated or revoked.  Performed at [facility], [place] [address] [place], [place] [address]   Aerobic/Anaerobic Culture w Gram Stain (surgical/deep wound)     Status: None (Preliminary result)   Collection Time: 07/20/22  5:00 PM   Specimen: PATH Other; Tissue  Result Value Ref Range Status   Specimen Description   Final    TISSUE PREPATELLAL BURSA Performed at [facility] [address],  [place], [place] [address]    Special Requests   Final    NONE Performed at [facility], [place] [address], [place], [place] [address]    Gram Stain   Final    NO WBC SEEN NO ORGANISMS SEEN Performed at [facility], [place] [address] [place], [place] [address]    Culture PENDING  Incomplete   Report Status PENDING  Incomplete  Aerobic/Anaerobic Culture w Gram Stain (surgical/deep wound)     Status: None (Preliminary result)   Collection Time: 07/20/22  5:02 PM   Specimen: PATH Other; Tissue  Result Value Ref Range Status   Specimen Description   Final    TISSUE PREPATELLA BURSA Performed at [facility], [place] [address], [place], [place] [address]    Special Requests   Final    NONE Performed at [facility], [place] [address] [place], [place] [address]    Gram Stain   Final    NO WBC SEEN FEW GRAM POSITIVE COCCI Performed at [facility], [place] [address] [place], [place] [address]    Culture PENDING  Incomplete   Report Status PENDING  Incomplete         Radiology Studies: DG Knee Complete 4 Views Left  Result Date: 07/19/2022 CLINICAL DATA:  Septic joint.  Left knee pain and infection EXAM: LEFT KNEE - COMPLETE 4+ VIEW COMPARISON:  06/27/2022 FINDINGS: No evidence of acute fracture or dislocation of the left knee. No focal bone lesion or bone destruction. No cortical erosion or sclerosis to suggest evidence of osteomyelitis. Joint spaces are normal. No significant effusion. Subcutaneous soft tissue swelling anterior to the patella and patellar ligament similar to prior study. No soft tissue gas or radiopaque foreign body demonstrated. IMPRESSION: 1. Soft tissue swelling anterior to the patella and patellar ligament. No significant effusion. 2. No acute bony abnormalities. Electronically Signed   By: Lucienne Capers M.D.   On: 07/19/2022 23:50        Scheduled Meds:  acetaminophen  1,000 mg Oral Q6H   aspirin   81 mg Oral BID   docusate sodium  100 mg Oral BID   Continuous Infusions:  sodium chloride 125 mL (07/21/22 1045)    ceFAZolin (ANCEF) IV 2 g (07/21/22 0532)   methocarbamol (ROBAXIN) IV     vancomycin 1,000 mg (07/21/22 1046)     LOS: 1 day    Time spent: 40 minutes    Irine Seal, MD [facility]   To contact the attending provider between 7A-7P or the covering provider during  after hours 7P-7A, please log into the web site [email or web address] and access using universal [place] password for that web site. If you do not have the password, please call the hospital operator.  07/21/2022, 10:58 AM

## 2022-07-21 NOTE — Evaluation (Signed)
Physical Therapy Evaluation Patient Details Name: Johnathan Brown MRN: [id number] DOB: [birth year] Today's Date: 07/21/2022  History of Present Illness  Pt is a 47 year old male admitted 07/19/22 with severe sepsis secondary to left knee infection/concern for septic arthritis and now s/p left knee prepatellar bursa excision with I&D.  Patient admitted at [facility] [place] on 06/23/2022 for left knee cellulitis however left AMA.  PMHx: R arm abscess s/p I&D 01/25/15, back pain, polysubstance abuse with IV drug use of heroin, cocaine, methamphetamines, tobacco abuse, GERD.  Clinical Impression  Patient is s/p above surgery resulting in functional limitations due to the deficits listed below (see PT Problem List).  Patient will benefit from acute skilled PT to increase their independence and safety with mobility to facilitate discharge.  Pt assisted with ambulating in hallway.  Pt with periods of dizziness during mobility and briefly requiring min assist.  Pt educated on precautions of no left knee flexion and wearing KI.  Pt lives next door to his mother.  Anticipate pt to progress well however pt will likely need RW upon d/c.         Recommendations for follow up therapy are one component of a multi-disciplinary discharge planning process, led by the attending physician.  Recommendations may be updated based on patient status, additional functional criteria and insurance authorization.  Follow Up Recommendations       Assistance Recommended at Discharge PRN  Patient can return home with the following       Equipment Recommendations Rolling walker (2 wheels)  Recommendations for Other Services       Functional Status Assessment Patient has had a recent decline in their functional status and demonstrates the ability to make significant improvements in function in a reasonable and predictable amount of time.     Precautions / Restrictions Precautions Precautions: Knee;Fall Precaution Comments: no knee  flexion Required Braces or Orthoses: Knee Immobilizer - Left Knee Immobilizer - Left: On at all times Restrictions Other Position/Activity Restrictions: WBAT with KI      Mobility  Bed Mobility Overal bed mobility: Needs Assistance Bed Mobility: Supine to Sit     Supine to sit: Min assist, HOB elevated     General bed mobility comments: pt elevated HOB, provided assist/support for Lt LE due to pain    Transfers Overall transfer level: Needs assistance Equipment used: Rolling walker (2 wheels) Transfers: Sit to/from Stand Sit to Stand: Min guard           General transfer comment: pt required a couple minutes at EOB due to dizziness which did resolve, verbal cues for UE and LE positioning    Ambulation/Gait Ambulation/Gait assistance: Min assist Gait Distance (Feet): 80 Feet Assistive device: Rolling walker (2 wheels) Gait Pattern/deviations: Step-to pattern, Decreased stance time - left, Antalgic Gait velocity: variable     General Gait Details: verbal cues for sequence, RW positioning, pace (slowing down), brief assist for steadying as pt needed rest break, denied dizziness but then upon sitting down stated he had became dizzy but dizziness improved  Stairs            Wheelchair Mobility    Modified Rankin (Stroke Patients Only)       Balance                                             Pertinent Vitals/Pain Pain Assessment  Pain Assessment: Faces Faces Pain Scale: Hurts little more Pain Location: left knee Pain Descriptors / Indicators: Sore, Grimacing, Guarding Pain Intervention(s): Repositioned, Premedicated before session, Monitored during session, Patient requesting pain meds-RN notified    Home Living Family/patient expects to be discharged to:: Private residence Living Arrangements: Alone Available Help at Discharge: Family;Neighbor Type of Home: House Home Access: Stairs to enter   Technical brewer of Steps:  2   Home Layout: One level Home Equipment: None      Prior Function Prior Level of Function : Independent/Modified Independent                     Hand Dominance        Extremity/Trunk Assessment        Lower Extremity Assessment Lower Extremity Assessment: LLE deficits/detail LLE Deficits / Details: no knee flexion per orders, maintained KI, able to perform ankle pumps however less AROM observed on left       Communication   Communication: No difficulties  Cognition Arousal/Alertness: Awake/alert Behavior During Therapy: WFL for tasks assessed/performed Overall Cognitive Status: Within Functional Limits for tasks assessed                                          General Comments      Exercises     Assessment/Plan    PT Assessment Patient needs continued PT services  PT Problem List Decreased mobility;Pain;Decreased knowledge of use of DME;Decreased knowledge of precautions       PT Treatment Interventions Gait training;DME instruction;Therapeutic exercise;Functional mobility training;Therapeutic activities;Patient/family education;Stair training    PT Goals (Current goals can be found in the Care Plan section)  Acute Rehab PT Goals PT Goal Formulation: With patient Time For Goal Achievement: 08/03/22 Potential to Achieve Goals: Good    Frequency Min 5X/week     Co-evaluation               AM-PAC PT "6 Clicks" Mobility  Outcome Measure Help needed turning from your back to your side while in a flat bed without using bedrails?: A Little Help needed moving from lying on your back to sitting on the side of a flat bed without using bedrails?: A Little Help needed moving to and from a bed to a chair (including a wheelchair)?: A Little Help needed standing up from a chair using your arms (e.g., wheelchair or bedside chair)?: A Little Help needed to walk in hospital room?: A Little Help needed climbing 3-5 steps with a railing?  : A Lot 6 Click Score: 17    End of Session Equipment Utilized During Treatment: Gait belt;Left knee immobilizer Activity Tolerance: Patient tolerated treatment well Patient left: in chair;with call bell/phone within reach;with chair alarm set;with family/visitor present Nurse Communication: Mobility status PT Visit Diagnosis: Difficulty in walking, not elsewhere classified (R26.2)    Time: [id number] PT Time Calculation (min) (ACUTE ONLY): 20 min   Charges:   PT Evaluation $PT Eval Low Complexity: 1 Low        Kati PT, DPT Physical Therapist Acute Rehabilitation Services Office: [phone]   Kati L Payson 07/21/2022, 12:12 PM

## 2022-07-22 ENCOUNTER — Emergency Department (HOSPITAL_COMMUNITY)
Admission: EM | Admit: 2022-07-22 | Discharge: 2022-07-22 | Discharge status: Against Medical Advice | Payer: 59 | Attending: Emergency Medicine | Admitting: Emergency Medicine

## 2022-07-22 ENCOUNTER — Encounter (HOSPITAL_COMMUNITY): Payer: Self-pay

## 2022-07-22 ENCOUNTER — Emergency Department (HOSPITAL_COMMUNITY): Payer: 59

## 2022-07-22 DIAGNOSIS — B9689 Other specified bacterial agents as the cause of diseases classified elsewhere: Secondary | ICD-10-CM | POA: Diagnosis not present

## 2022-07-22 DIAGNOSIS — M71162 Other infective bursitis, left knee: Principal | ICD-10-CM | POA: Diagnosis present

## 2022-07-22 DIAGNOSIS — L0390 Cellulitis, unspecified: Secondary | ICD-10-CM | POA: Diagnosis present

## 2022-07-22 DIAGNOSIS — F1910 Other psychoactive substance abuse, uncomplicated: Secondary | ICD-10-CM | POA: Diagnosis present

## 2022-07-22 DIAGNOSIS — D649 Anemia, unspecified: Secondary | ICD-10-CM | POA: Diagnosis not present

## 2022-07-22 DIAGNOSIS — R000 Tachycardia, unspecified: Secondary | ICD-10-CM | POA: Diagnosis present

## 2022-07-22 DIAGNOSIS — E876 Hypokalemia: Secondary | ICD-10-CM | POA: Diagnosis not present

## 2022-07-22 DIAGNOSIS — E872 Acidosis, unspecified: Secondary | ICD-10-CM | POA: Diagnosis present

## 2022-07-22 DIAGNOSIS — E871 Hypo-osmolality and hyponatremia: Secondary | ICD-10-CM | POA: Diagnosis present

## 2022-07-22 DIAGNOSIS — M009 Pyogenic arthritis, unspecified: Secondary | ICD-10-CM | POA: Diagnosis present

## 2022-07-22 DIAGNOSIS — M25562 Pain in left knee: Secondary | ICD-10-CM | POA: Diagnosis present

## 2022-07-22 LAB — PROTIME-INR
INR: 1 (ref 0.8–1.2)
Prothrombin Time: 12.6 seconds (ref 11.4–15.2)

## 2022-07-22 LAB — CBC WITH DIFFERENTIAL/PLATELET
Abs Immature Granulocytes: 0.18 10*3/uL — ABNORMAL HIGH (ref 0.00–0.07)
Basophils Absolute: 0 10*3/uL (ref 0.0–0.1)
Basophils Relative: 0 %
Eosinophils Absolute: 0.1 10*3/uL (ref 0.0–0.5)
Eosinophils Relative: 0 %
HCT: 34.7 % — ABNORMAL LOW (ref 39.0–52.0)
Hemoglobin: 11.4 g/dL — ABNORMAL LOW (ref 13.0–17.0)
Immature Granulocytes: 1 %
Lymphocytes Relative: 9 %
Lymphs Abs: 1.4 10*3/uL (ref 0.7–4.0)
MCH: 28.5 pg (ref 26.0–34.0)
MCHC: 32.9 g/dL (ref 30.0–36.0)
MCV: 86.8 fL (ref 80.0–100.0)
Monocytes Absolute: 1.4 10*3/uL — ABNORMAL HIGH (ref 0.1–1.0)
Monocytes Relative: 9 %
Neutro Abs: 12.3 10*3/uL — ABNORMAL HIGH (ref 1.7–7.7)
Neutrophils Relative %: 81 %
Platelets: 214 10*3/uL (ref 150–400)
RBC: 4 MIL/uL — ABNORMAL LOW (ref 4.22–5.81)
RDW: 14.1 % (ref 11.5–15.5)
WBC: 15.3 10*3/uL — ABNORMAL HIGH (ref 4.0–10.5)
nRBC: 0 % (ref 0.0–0.2)

## 2022-07-22 LAB — COMPREHENSIVE METABOLIC PANEL
ALT: 14 U/L (ref 0–44)
AST: 18 U/L (ref 15–41)
Albumin: 3.2 g/dL — ABNORMAL LOW (ref 3.5–5.0)
Alkaline Phosphatase: 72 U/L (ref 38–126)
Anion gap: 7 (ref 5–15)
BUN: 19 mg/dL (ref 6–20)
CO2: 25 mmol/L (ref 22–32)
Calcium: 8.1 mg/dL — ABNORMAL LOW (ref 8.9–10.3)
Chloride: 107 mmol/L (ref 98–111)
Creatinine, Ser: 0.69 mg/dL (ref 0.61–1.24)
GFR, Estimated: >60 mL/min (ref >60)
Glucose, Bld: 92 mg/dL (ref 70–99)
Potassium: 3.1 mmol/L — ABNORMAL LOW (ref 3.5–5.1)
Sodium: 139 mmol/L (ref 135–145)
Total Bilirubin: 0.3 mg/dL (ref 0.3–1.2)
Total Protein: 6.8 g/dL (ref 6.5–8.1)

## 2022-07-22 LAB — LACTIC ACID, PLASMA: Lactic Acid, Venous: 1.3 mmol/L (ref 0.5–1.9)

## 2022-07-22 LAB — APTT: aPTT: 24 seconds (ref 24–36)

## 2022-07-22 MED ORDER — POLYETHYLENE GLYCOL 3350 17 G PO PACK: 17.0000 g | PACK | Freq: Every day | ORAL | Status: DC | PRN

## 2022-07-22 MED ORDER — ONDANSETRON HCL 4 MG/2ML IJ SOLN: 4.0000 mg | Freq: Four times a day (QID) | INTRAMUSCULAR | Status: DC | PRN

## 2022-07-22 MED ORDER — METOPROLOL TARTRATE 5 MG/5ML IV SOLN: 5.0000 mg | Freq: Four times a day (QID) | INTRAVENOUS | Status: DC | PRN

## 2022-07-22 MED ORDER — ENOXAPARIN SODIUM 40 MG/0.4ML IJ SOSY: 40.0000 mg | PREFILLED_SYRINGE | INTRAMUSCULAR | Status: DC

## 2022-07-22 MED ORDER — POTASSIUM CHLORIDE CRYS ER 20 MEQ PO TBCR
40.0000 meq | EXTENDED_RELEASE_TABLET | Freq: Once | ORAL | Status: AC
  Administered 2022-07-22: 40 meq via ORAL
  Filled 2022-07-22: qty 2

## 2022-07-22 MED ORDER — SODIUM CHLORIDE 0.9 % IV BOLUS
500.0000 mL | Freq: Once | INTRAVENOUS | Status: AC
  Administered 2022-07-22: 500 mL via INTRAVENOUS

## 2022-07-22 MED ORDER — VANCOMYCIN HCL 1500 MG/300ML IV SOLN
1500.0000 mg | Freq: Two times a day (BID) | INTRAVENOUS | Status: DC
  Administered 2022-07-22: 1500 mg via INTRAVENOUS
  Filled 2022-07-22: qty 300

## 2022-07-22 MED ORDER — OXYCODONE HCL 5 MG PO TABS
5.0000 mg | ORAL_TABLET | ORAL | Status: DC | PRN
  Administered 2022-07-22: 5 mg via ORAL
  Filled 2022-07-22: qty 1

## 2022-07-22 MED ORDER — ACETAMINOPHEN 650 MG RE SUPP: 650.0000 mg | Freq: Four times a day (QID) | RECTAL | Status: DC | PRN

## 2022-07-22 MED ORDER — CEFAZOLIN SODIUM-DEXTROSE 2-4 GM/100ML-% IV SOLN
2.0000 g | Freq: Three times a day (TID) | INTRAVENOUS | Status: DC
  Administered 2022-07-22: 2 g via INTRAVENOUS
  Filled 2022-07-22: qty 100

## 2022-07-22 MED ORDER — ONDANSETRON HCL 4 MG PO TABS: 4.0000 mg | ORAL_TABLET | Freq: Four times a day (QID) | ORAL | Status: DC | PRN

## 2022-07-22 MED ORDER — DOCUSATE SODIUM 100 MG PO CAPS: 100.0000 mg | ORAL_CAPSULE | Freq: Two times a day (BID) | ORAL | Status: DC

## 2022-07-22 MED ORDER — ACETAMINOPHEN 325 MG PO TABS: 650.0000 mg | ORAL_TABLET | Freq: Four times a day (QID) | ORAL | Status: DC | PRN

## 2022-07-22 NOTE — Progress Notes (Signed)
Patient called the RN that he want to leave and do AMA. Patient is A&O X4. On-call and AC on bedside educated the patient about the risk of leaving. Patient spoke with his mother and his mother will drive him to home.

## 2022-07-22 NOTE — ED Provider Triage Note (Signed)
Emergency Medicine Provider Triage Evaluation Note  Johnathan Brown , a 47 y.o. male  was evaluated in triage.  Pt complains of left knee pain and infection.  He reports that he was at the hospital yesterday but "the Asian nurses would not give me my pain meds" and so he left [place].  He returns today stating that he was notified that he was septic and needed to come back to the hospital for IV antibiotics.  He denies fever, chills, nausea, vomiting, weakness or paresthesias in the leg.  Review of Systems  Positive: See HPI Negative: See HPI  Physical Exam  BP (!) 137/97 (BP Location: Left Arm)   Pulse (!) 106   Temp 97.6 F (36.4 C) (Oral)   Resp 18   Ht 5\' 11"  (1.803 m)   Wt 77.1 kg   SpO2 99%   BMI 23.71 kg/m  Gen:   Awake, no distress   Resp:  Normal effort  MSK:   Moves extremities without difficulty  Other:  Mildly tachycardic, lungs clear to auscultation, patient appears easily agitated and is currently expressing dissatisfaction with recent hospital care, left lower extremity is bandaged but there is bloody, purulent discharge on bandage over the left knee which patient is guarding  Medical Decision Making  Medically screening exam initiated at 10:09 AM.  Appropriate orders placed.  Johnathan Brown was informed that the remainder of the evaluation will be completed by another provider, this initial triage assessment does not replace that evaluation, and the importance of remaining in the ED until their evaluation is complete.     Suzzette Righter, PA-C 07/22/22 1742

## 2022-07-22 NOTE — Progress Notes (Signed)
    [facility] for Infectious Disease    Date of Admission:  07/22/2022     ID: Johnathan Brown is a 47 y.o. male with   Principal Problem:   Septic infrapatellar bursitis of left knee Active Problems:   Substance abuse (HCC)   Cellulitis   Infection of left knee (HCC)   Hyponatremia   Septic prepatellar bursitis of left knee   Tachycardia   Lactic acidosis   Hypokalemia    ID: Johnathan Brown is a 47 y.o. male with medical history significant for polysubstance abuse with IV drug use including heroin, cocaine, methamphetamines, also has a history of GERD, he was admitted to [facility] on 06/23/2022 for left knee cellulitis but left AMA.  Return to the ED for his later received IV vancomycin discharged with prescription for oral doxycycline.  He took 5 days worth of Doxy presented back to the ER with worsening infection of the left knee found to be septic.  Plain film of the left knee at that time showed evidence of soft tissue swelling anterior to the patella and patellar ligament patient was placed empirically on IV vancomycin, IV Rocephin, and IV fluids.  ID and orthopedic surgery were consulted, they underwent left knee excisional debridement of the septic prepatellar bursa with closure over Hemovac drain with orthopedic surgery on 07/20/2022.  In the early morning hours today, patient left the hospital AMA.  States to me he was not getting along with his floor nurse last night, he decided to leave   He was talked into coming back to the ED by his family for further management but again he is now wanting to leave AMA.  Cx: strep pyogenes noted on cx from OR on 3/29  Lab Results Recent Labs    07/21/22 0920 07/22/22 1029  WBC 16.6* 15.3*  HGB 12.0* 11.4*  HCT 38.0* 34.7*  NA 135 139  K 3.5 3.1*  CL 102 107  CO2 24 25  BUN 16 19  CREATININE 0.74 0.69   Liver Panel Recent Labs    07/19/22 2243 07/22/22 1029  PROT 8.2* 6.8  ALBUMIN 4.0 3.2*  AST 23 18  ALT 16 14  ALKPHOS  96 72  BILITOT 0.9 0.3   Studies/Results: DG Knee Complete 4 Views Left  Result Date: 07/22/2022 CLINICAL DATA:  Left knee pain EXAM: LEFT KNEE - COMPLETE 4+ VIEW COMPARISON:  07/19/2022 FINDINGS: No acute fracture or dislocation is noted. Previously seen prepatellar soft tissue swelling is no longer identified consistent with recent surgical intervention. No joint effusion is seen. IMPRESSION: No acute abnormality noted. Electronically Signed   By: Inez Catalina M.D.   On: 07/22/2022 10:48     Assessment/Plan: If getting admitted - continue on cefazolin 2gm IV q 8hr for the time being then can discuss treatment plan given hx of opiate use. Possibly could give long acting infusion, dalbavancin. Recommend to give high dose amoxicillin 1gm TID for 4 wk if choosing to leave ama.  [facility] for Infectious Diseases Pager: [phone]  07/22/2022, 3:36 PM

## 2022-07-22 NOTE — Progress Notes (Signed)
Pharmacy Antibiotic Note  AZEEM SABIC is a 47 y.o. male admitted on 07/22/2022 with  patellar septic bursitis .  Pharmacy has been consulted for vanc/abncef dosing.  Plan: See 3/30 Pharmacist Note prior to patient leaving AMA Restart Vanc 1500mg  IV q12 Restart Ancef 2g IV q8 Patellar Cx - strep pyogenes Wait for ID on narrowing recommendations  Height: 5\' 11"  (180.3 cm) Weight: 77.1 kg (170 lb) IBW/kg (Calculated) : 75.3  Temp (24hrs), Avg:97.9 F (36.6 C), Min:97.6 F (36.4 C), Max:98.1 F (36.7 C)  Recent Labs  Lab 07/19/22 2243 07/19/22 2358 07/20/22 0132 07/20/22 0525 07/21/22 0920 07/22/22 1029  WBC 20.9*  --   --  20.3* 16.6* 15.3*  CREATININE 1.06  --   --  0.98 0.74  --   LATICACIDVEN  --  2.4* 2.0*  --   --  1.3    Estimated Creatinine Clearance: 124.2 mL/min (by C-G formula based on SCr of 0.74 mg/dL).    No Known Allergies    Thank you for allowing pharmacy to be a part of this patient's care.  Kara Mead 07/22/2022 11:14 AM

## 2022-07-22 NOTE — ED Notes (Signed)
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 Admitting MD notified. Pt aware of the risks of leaving including death. Pt still adamant about leaving.

## 2022-07-22 NOTE — ED Triage Notes (Signed)
Pt states that he left AMA last night from upstairs, due to not receiving pain meds and antibiotics. Pt states that he did not get along with night shift nurse. Pt's sister encouraged him to come back.

## 2022-07-22 NOTE — H&P (Signed)
History and Physical  Johnathan Brown [id number] DOB: [birth year] DOA: 07/22/2022  PCP: Patient, No Pcp Per   Chief Complaint: joint swelling   HPI: Johnathan Brown is a 47 y.o. male with medical history significant for polysubstance abuse with IV drug use including heroin, cocaine, methamphetamines, also has a history of GERD, he was admitted to [facility] on 06/23/2022 for left knee cellulitis but left AMA.  Return to the ED for his later received IV vancomycin discharged with prescription for oral doxycycline.  He took 5 days worth of Doxy presented back to the ER with worsening infection of the left knee found to be septic.  Plain film of the left knee at that time showed evidence of soft tissue swelling anterior to the patella and patellar ligament patient was placed empirically on IV vancomycin, IV Rocephin, and IV fluids.  ID and orthopedic surgery were consulted, they underwent left knee excisional debridement of the septic prepatellar bursa with closure over Hemovac drain with orthopedic surgery on 07/20/2022.  In the early morning hours today, patient left the hospital AMA.  States to me he was not getting along with his floor nurse last night, he decided to leave.  However family member convinced him to come back to the hospital today.   ED Course: Upon evaluation in the emergency department, patient is afebrile, nontoxic in appearance.  Intraoperative cultures have returned, I asked that ER provider discussed with ID regarding necessity of continued IV antibiotics.  Discussed with Dr. Baxter Flattery, who recommends readmission to the hospital, and continuation of IV Ancef only.  Review of Systems: Please see HPI for pertinent positives and negatives. A complete 10 system review of systems are otherwise negative.  Past Medical History:  Diagnosis Date   Abscess 01/22/2015   ADHD    GERD (gastroesophageal reflux disease)    Past Surgical History:  Procedure Laterality Date   I & D EXTREMITY Right  01/25/2015   Procedure: MINOR IRRIGATION AND DEBRIDEMENT RIGHT ARM;  Surgeon: Newt Minion, MD;  Location: [place];  Service: Orthopedics;  Laterality: Right;   INNER EAR SURGERY     x 5    Social History:  reports that he has been smoking cigarettes. He has a 7.50 pack-year smoking history. He has never used smokeless tobacco. He reports that he does not currently use drugs. He reports that he does not drink alcohol.   No Known Allergies  Family History  Problem Relation Age of Onset   Hypertension Father    Diabetes Father    Crohn's disease Father      Prior to Admission medications   Medication Sig Start Date End Date Taking? Authorizing Provider  amphetamine-dextroamphetamine (ADDERALL) 20 MG tablet Take 20 mg by mouth 2 (two) times daily.   Yes [provider]  buprenorphine (SUBUTEX) 8 MG SUBL SL tablet Place 8 mg under the tongue daily.   Yes [provider]  doxycycline (VIBRAMYCIN) 100 MG capsule Take 1 capsule (100 mg total) by mouth 2 (two) times daily. Patient not taking: Reported on 07/19/2022 06/27/22   Valarie Merino, MD    Physical Exam: BP (!) 135/98   Pulse 81   Temp 97.9 F (36.6 C) (Oral)   Resp 17   Ht 5\' 11"  (1.803 m)   Wt 77.1 kg   SpO2 100%   BMI 23.71 kg/m   General:  Alert, oriented, calm, in no acute distress, overall looks nontoxic Eyes: EOMI, clear conjuctivae, white sclerea Neck: supple,  no masses, trachea mildline  Cardiovascular: RRR, no murmurs or rubs, no peripheral edema  Respiratory: clear to auscultation bilaterally, no wheezes, no crackles  Abdomen: soft, nontender, nondistended, normal bowel tones heard  Skin: dry, no rashes  Musculoskeletal: Left knee in immobilizer, not examined Psychiatric: appropriate affect, normal speech  Neurologic: extraocular muscles intact, clear speech, moving all extremities with intact sensorium          Labs on Admission:  Basic Metabolic Panel: Recent Labs  Lab 07/19/22 2243  07/20/22 0525 07/21/22 0920 07/22/22 1029  NA 133* 130* 135 139  K 3.9 3.8 3.5 3.1*  CL 97* 99 102 107  CO2 25 22 24 25   GLUCOSE 107* 132* 148* 92  BUN 19 15 16 19   CREATININE 1.06 0.98 0.74 0.69  CALCIUM 8.7* 7.5* 8.0* 8.1*  MG  --   --  2.2  --    Liver Function Tests: Recent Labs  Lab 07/19/22 2243 07/22/22 1029  AST 23 18  ALT 16 14  ALKPHOS 96 72  BILITOT 0.9 0.3  PROT 8.2* 6.8  ALBUMIN 4.0 3.2*   No results for input(s): "LIPASE", "AMYLASE" in the last 168 hours. No results for input(s): "AMMONIA" in the last 168 hours. CBC: Recent Labs  Lab 07/19/22 2243 07/20/22 0525 07/21/22 0920 07/22/22 1029  WBC 20.9* 20.3* 16.6* 15.3*  NEUTROABS 18.3*  --  14.8* 12.3*  HGB 14.3 12.3* 12.0* 11.4*  HCT 44.2 38.1* 38.0* 34.7*  MCV 88.4 88.8 89.8 86.8  PLT 180 160 167 214   Cardiac Enzymes: No results for input(s): "CKTOTAL", "CKMB", "CKMBINDEX", "TROPONINI" in the last 168 hours.  BNP (last 3 results) No results for input(s): "BNP" in the last 8760 hours.  ProBNP (last 3 results) No results for input(s): "PROBNP" in the last 8760 hours.  CBG: No results for input(s): "GLUCAP" in the last 168 hours.  Radiological Exams on Admission: DG Knee Complete 4 Views Left  Result Date: 07/22/2022 CLINICAL DATA:  Left knee pain EXAM: LEFT KNEE - COMPLETE 4+ VIEW COMPARISON:  07/19/2022 FINDINGS: No acute fracture or dislocation is noted. Previously seen prepatellar soft tissue swelling is no longer identified consistent with recent surgical intervention. No joint effusion is seen. IMPRESSION: No acute abnormality noted. Electronically Signed   By: Inez Catalina M.D.   On: 07/22/2022 10:48    Assessment/Plan Principal Problem:   Septic infrapatellar bursitis of left knee-this is an ongoing problem, patient is currently not septic, is hemodynamically stable. -Inpatient admission -ID is aware of his return and will follow -Continue IV Ancef, await further guidance from  ID Active Problems:   Infection of left knee (HCC)   Substance abuse (HCC)   Cellulitis   Hyponatremia   Septic prepatellar bursitis of left knee   Tachycardia   Lactic acidosis, resolved   Hypokalemia-potassium 3.1 at the time of admission, will give a dose of potassium orally, and recheck in the morning  DVT prophylaxis: Lovenox     Code Status: Full Code  Consults called: Infectious disease  Admission status: The appropriate patient status for this patient is INPATIENT. Inpatient status is judged to be reasonable and necessary in order to provide the required intensity of service to ensure the patient's safety. The patient's presenting symptoms, physical exam findings, and initial radiographic and laboratory data in the context of their chronic comorbidities is felt to place them at high risk for further clinical deterioration. Furthermore, it is not anticipated that the patient will be medically stable  for discharge from the hospital within 2 midnights of admission.    I certify that at the point of admission it is my clinical judgment that the patient will require inpatient hospital care spanning beyond 2 midnights from the point of admission due to high intensity of service, high risk for further deterioration and high frequency of surveillance required   Time spent: 46 minutes  Clementine Soulliere Neva Seat MD [facility] Pager [phone]  If 7PM-7AM, please contact night-coverage [email or web address] Password [facility]  07/22/2022, 2:16 PM

## 2022-07-22 NOTE — Anesthesia Postprocedure Evaluation (Signed)
Anesthesia Post Note  Patient: CHANNON AGUDELO  Procedure(s) Performed: LEFT KNEE PERIPATAR BURSA IRRIGATION AND DEBRIDEMENT POSSIBLE ARTHROSCOPIC KNEE WASHOUT (Left: Knee)     Patient location during evaluation: PACU Anesthesia Type: General Level of consciousness: awake and patient cooperative Pain management: pain level controlled Vital Signs Assessment: post-procedure vital signs reviewed and stable Respiratory status: spontaneous breathing, nonlabored ventilation, respiratory function stable and patient connected to nasal cannula oxygen Cardiovascular status: blood pressure returned to baseline and stable Postop Assessment: no apparent nausea or vomiting Anesthetic complications: no   No notable events documented.  Last Vitals:  Vitals:   07/21/22 1034 07/21/22 2104  BP: 115/83 125/85  Pulse: 93 80  Resp: 20 18  Temp: 36.4 C 36.7 C  SpO2: 100% 98%    Last Pain:  Vitals:   07/21/22 2200  TempSrc:   PainSc: 7                  Ginna Schuur

## 2022-07-22 NOTE — ED Provider Notes (Signed)
[place] EMERGENCY DEPARTMENT AT [facility] Provider Note   CSN: [id number] Arrival date & time: 07/22/22  [id number]     History  Chief Complaint  Patient presents with   Knee Pain    Johnathan Brown is a 47 y.o. male history significant for polysubstance abuse/IVDU, GERD.  Patient presents to the ER for further treatment of his left knee infection.  He reports that he had been admitted to the hospital for sepsis and left knee infection over the weekend.  He reports that he was upset with his care team last night and left AGAINST MEDICAL ADVICE at 1 AM.  He reports that he got home but then received a call from his sister-in-law who is an Therapist, sports and then decided to come back for further treatment.  Patient denies any fall or injury since he left he denies any additional concerns.  Since patient left he denies any new fevers or chills denies fall/injury, numbness or any additional concerns.  HPI     Home Medications Prior to Admission medications   Medication Sig Start Date End Date Taking? Authorizing Provider  amphetamine-dextroamphetamine (ADDERALL) 20 MG tablet Take 20 mg by mouth 2 (two) times daily.   Yes [provider]  buprenorphine (SUBUTEX) 8 MG SUBL SL tablet Place 8 mg under the tongue daily.   Yes [provider]  doxycycline (VIBRAMYCIN) 100 MG capsule Take 1 capsule (100 mg total) by mouth 2 (two) times daily. Patient not taking: Reported on 07/19/2022 06/27/22   Valarie Merino, MD      Allergies    Patient has no known allergies.    Review of Systems   Review of Systems Ten systems are reviewed and are negative for acute change except as noted in the HPI  Physical Exam Updated Vital Signs BP (!) 135/98   Pulse 81   Temp 97.9 F (36.6 C) (Oral)   Resp 17   Ht 5\' 11"  (1.803 m)   Wt 77.1 kg   SpO2 100%   BMI 23.71 kg/m  Physical Exam Constitutional:      General: He is not in acute distress.    Appearance: Normal appearance.  He is well-developed. He is not ill-appearing or diaphoretic.  HENT:     Head: Normocephalic and atraumatic.  Eyes:     General: Vision grossly intact. Gaze aligned appropriately.     Pupils: Pupils are equal, round, and reactive to light.  Neck:     Trachea: Trachea and phonation normal.  Pulmonary:     Effort: Pulmonary effort is normal. No respiratory distress.  Abdominal:     General: There is no distension.     Palpations: Abdomen is soft.     Tenderness: There is no abdominal tenderness. There is no guarding or rebound.  Musculoskeletal:     Cervical back: Normal range of motion.     Comments: Left knee: Incision clean and intact.  Mild surrounding erythema.  ROM left knee declined due to recent surgery.  Pedal pulses intact.  Sensation and capillary fill intact distally.  Skin:    General: Skin is warm and dry.  Neurological:     Mental Status: He is alert.     GCS: GCS eye subscore is 4. GCS verbal subscore is 5. GCS motor subscore is 6.     Comments: Speech is clear and goal oriented, follows commands Major Cranial nerves without deficit, no facial droop Moves extremities without ataxia, coordination intact  Psychiatric:  Behavior: Behavior normal.     ED Results / Procedures / Treatments   Labs (all labs ordered are listed, but only abnormal results are displayed) Labs Reviewed  COMPREHENSIVE METABOLIC PANEL - Abnormal; Notable for the following components:      Result Value   Potassium 3.1 (*)    Calcium 8.1 (*)    Albumin 3.2 (*)    All other components within normal limits  CBC WITH DIFFERENTIAL/PLATELET - Abnormal; Notable for the following components:   WBC 15.3 (*)    RBC 4.00 (*)    Hemoglobin 11.4 (*)    HCT 34.7 (*)    Neutro Abs 12.3 (*)    Monocytes Absolute 1.4 (*)    Abs Immature Granulocytes 0.18 (*)    All other components within normal limits  LACTIC ACID, PLASMA  PROTIME-INR  APTT  LACTIC ACID, PLASMA  CBC  CREATININE, SERUM     EKG EKG Interpretation  Date/Time:  Sunday July 22 2022 10:17:45 EDT Ventricular Rate:  86 PR Interval:  149 QRS Duration: 107 QT Interval:  366 QTC Calculation: 438 R Axis:   115 Text Interpretation: Sinus rhythm Right axis deviation Confirmed by Regan Lemming ([id number]) on 07/22/2022 1:21:30 PM  Radiology DG Knee Complete 4 Views Left  Result Date: 07/22/2022 CLINICAL DATA:  Left knee pain EXAM: LEFT KNEE - COMPLETE 4+ VIEW COMPARISON:  07/19/2022 FINDINGS: No acute fracture or dislocation is noted. Previously seen prepatellar soft tissue swelling is no longer identified consistent with recent surgical intervention. No joint effusion is seen. IMPRESSION: No acute abnormality noted. Electronically Signed   By: Inez Catalina M.D.   On: 07/22/2022 10:48    Procedures Procedures    Medications Ordered in ED Medications  ceFAZolin (ANCEF) IVPB 2g/100 mL premix (2 g Intravenous New Bag/Given 07/22/22 1355)  enoxaparin (LOVENOX) injection 40 mg (has no administration in time range)  acetaminophen (TYLENOL) tablet 650 mg (has no administration in time range)    Or  acetaminophen (TYLENOL) suppository 650 mg (has no administration in time range)  oxyCODONE (Oxy IR/ROXICODONE) immediate release tablet 5 mg (5 mg Oral Given 07/22/22 1432)  docusate sodium (COLACE) capsule 100 mg (has no administration in time range)  polyethylene glycol (MIRALAX / GLYCOLAX) packet 17 g (has no administration in time range)  ondansetron (ZOFRAN) tablet 4 mg (has no administration in time range)    Or  ondansetron (ZOFRAN) injection 4 mg (has no administration in time range)  metoprolol tartrate (LOPRESSOR) injection 5 mg (has no administration in time range)  sodium chloride 0.9 % bolus 500 mL (0 mLs Intravenous Stopped 07/22/22 1345)  potassium chloride SA (KLOR-CON M) CR tablet 40 mEq (40 mEq Oral Given 07/22/22 1432)    ED Course/ Medical Decision Making/ A&P Clinical Course as of 07/22/22 1443  Sun  Jul 22, 2022  1054 Vanco/cefazolin per ID consult note yesterday [BM]  1122 Dr. Renaee Munda accepts pt for admission. [BM]  1126 Dr. Renaee Munda requests consult to ID before admission. [BM]  1232 Dr. Baxter Flattery. Ancef or ampicillin [BM]    Clinical Course User Index [BM] Deliah Boston, PA-C                             Medical Decision Making 47 year old male returns to the ER today after leaving AMA.  Patient had left septic prepatellar bursitis which was debrided by Ortho and Hemovac was placed on 07/20/2022.  He left AMA very  early this morning after not getting along with his care team.  Family member convinced him to come back to the hospital.  He denies any injury or concerns since he left this morning.  Basic labs and x-ray were ordered in triage.  I plan to consult hospitalist to readmit the patient.  Amount and/or Complexity of Data Reviewed Labs:     Details: CBC shows at doses of 15.3.  Mild anemia of 11.4.  No thrombocytopenia. CMP shows hypokalemia of 3.1.  No other significant electrolyte derangement, no AKI, no gap, LFTs unremarkable. Lactic within normal limits. APTT and INR within normal limits. Radiology:     Details: I personally interpreted patient's 4 view radiograph of his left knee.  I do not appreciate any obvious acute displaced fractures, dislocations or bony lesions. ECG/medicine tests:     Details: I personally reviewed and interpreted patient's twelve-lead EKG.  Shows normal sinus rhythm.  I do not appreciate any obvious acute ischemic changes.  EKG reviewed with Dr. Armandina Gemma. Discussion of management or test interpretation with external provider(s): Consult with hospitalist Dr. Renaee Munda Consult with infectious disease specialist Dr. Baxter Flattery Discussed case with attending physician Dr. Armandina Gemma.  Risk Prescription drug management. Decision regarding hospitalization.   Dr. Graylon Good infectious disease recommends that patient can be treated with either Ancef or ampicillin mono  coverage.  Recommends patient be admitted to the hospital for IV antibiotics.  Patient excepted for admission by Dr. Renaee Munda.  Note: Portions of this report may have been transcribed using voice recognition software. Every effort was made to ensure accuracy; however, inadvertent computerized transcription errors may still be present.         Final Clinical Impression(s) / ED Diagnoses Final diagnoses:  Septic prepatellar bursitis of left knee    Rx / DC Orders ED Discharge Orders     None         Gari Crown 07/22/22 1443    Regan Lemming, MD 07/22/22 [phone]

## 2022-07-22 NOTE — Progress Notes (Addendum)
   Notified by RN for patient stating he is leaving AMA  Bedside visit with [facility] reveals patient Awake and Oriented x4. Patient converses readily and states his desire to leave in spite of his condition of which he acknowledges.. He has spoken to his mother who agrees to drive him to his desired location.  He is advised and acknowledges that he can call 9-1-1 or return to any ER for assistance should he feel the need. Patient is mobile under his own power without assistance and states will be transported by his family member.                                                 Against Medical Advice Patient at this time expresses desire to leave the Hospital immediately, patient has been warned that this is not Medically advisable at this time, and can result in Medical complications like Death and Disability, patient understands and accepts the risks involved and assumes full responsibilty of this decision.  This patient has also been advised that if they feel the need for further medical assistance to return to any available ER or dial 9-1-1.  Informed by Nursing staff that this patient has left care and has signed the form  Against Medical Advice on 07/22/2022 at 0052 Hrs.  Gershon Cull BSN MSNA MSN ACNPC-AG Acute Care Nurse Practitioner [place]

## 2022-07-23 ENCOUNTER — Encounter (HOSPITAL_COMMUNITY): Payer: Self-pay | Admitting: Orthopedic Surgery

## 2022-07-23 NOTE — Discharge Summary (Signed)
Physician Discharge Summary  Johnathan Brown [id number] DOB: [birth year] DOA: 07/19/2022  PCP: Johnathan Brown, No Pcp Per  Admit date: 07/19/2022 Discharge date: 07/22/2022       Johnathan Brown left AMA  Time spent: 30 minutes  Recommendations for Outpatient Follow-up:  Johnathan Brown left AMA   Discharge Diagnoses:  Principal Problem:   Severe sepsis Active Problems:   Infection of left knee   Sepsis without acute organ dysfunction   Substance abuse   Hyponatremia   Septic prepatellar bursitis of left knee   Discharge Condition: Johnathan Brown left AMA  Diet recommendation: Johnathan Brown left [facility]  Filed Weights   07/19/22 2211 07/20/22 1500  Weight: 76.7 kg 77.1 kg    History of present illness:  HPI per Dr. Georgiann Mccoy is a 47 y.o. male with medical history significant of IV drug abuse/heroin use, tobacco abuse, GERD.  Earlier this month on 3/2, he was admitted to [facility] for left knee cellulitis and left AMA.  He returned to the ED on 3/6 and was given a dose of vancomycin and discharged with prescription for doxycycline as he declined hospital admission.  He returns to the ED today for evaluation of worsening infection of his left knee.  In the ED, Johnathan Brown febrile with temperature 104 F and tachycardic up to 140s.  Blood pressure low with systolic in the 0000000.  Labs significant for WBC 20.9, sodium 133, lactic acid 2.4> 2.0, blood cultures drawn.  X-ray of the left knee showing soft tissue swelling anterior to the patella and patellar ligament without significant effusion and no acute bony abnormalities.  Johnathan Brown received Tylenol, Dilaudid, Ativan, Zofran, vancomycin, ceftriaxone, and 2.5 L IV fluid boluses.  TRH called to admit.   Johnathan Brown currently very somnolent.  He is able to open his eyes but falls asleep quickly and is not able to give any history.  His Johnathan Brown is at bedside who is a Electronics engineer.  Johnathan Brown states earlier this month Johnathan Brown was admitted to [facility] for infection of his left  knee and then had another ED visit a few days later and was prescribed doxycycline which he has finished taking but his left knee infection has not improved.  He has been complaining of a lot of pain in this knee.  Johnathan Brown states Johnathan Brown uses IV drugs but she is not sure which drugs exactly.   Hospital Course:  #1 severe sepsis secondary to left knee infection/concern for septic arthritis/septic prepatellar bursitis of left knee status post left knee excisional debridement of the septic prepatellar bursa with closure over Hemovac drain per Dr. Marlou Sa 07/20/2022 -Johnathan Brown initially presented to [facility] on 06/23/2022 with left knee cellulitis left AMA.  Returned back to the ED 06/27/2022 received dose of IV vancomycin discharged on doxycycline as Johnathan Brown refused admission at that time.  Johnathan Brown took 5 days worth of doxycycline presented back to the ED with worsening left knee pain, swelling, concern for infection. -Johnathan Brown met criteria for severe sepsis with a lactic acidosis, fever, tachycardia, soft/borderline blood pressure, leukocytosis, lactic acidosis. -Plan films of the left knee with soft tissue swelling anterior to the patella and patellar ligament without significant effusion, no acute bone abnormalities. -Blood cultures ordered and pending with no growth to date. -Johnathan Brown seen in consultation by ID who have ordered RPR which was nonreactive, HIV nonreactive, hep B surface antigen nonreactive, hep B surface antibody nonreactive, hep B core antibody nonreactive, hep C antibody reactive. -Johnathan Brown seen in consultation by orthopedics and Johnathan Brown noted to have a  septic prepatellar bursitis of the left knee and underwent left knee excisional debridement of the septic prepatellar bursa with closure over Hemovac drain and cultures obtained pending. -Johnathan Brown initially placed on IV vancomycin IV Rocephin, Johnathan Brown assessed by ID and antibiotics narrowed to IV cefazolin. -Johnathan Brown subsequently left AMA.   2.  Mild  hyponatremia -Likely secondary to dehydration. -Johnathan Brown hydrated with IV fluids.   -Johnathan Brown subsequently left AMA.    3.  Polysubstance abuse -Johnathan Brown with polysubstance abuse including active IV heroin abuse, cocaine use, methamphetamines. -UDS done positive for amphetamines, benzos, opiates, cocaine. -Cessation stressed to Johnathan Brown. -Johnathan Brown placed on withdrawal protocol as needed during the hospitalization, TOC consulted.   -Johnathan Brown subsequently left AMA.         Procedures: Left knee plain films 07/19/2022 Left knee excisional debridement of the septic prepatellar bursa with closure over Hemovac drain per orthopedics: Dr. Marlou Sa 07/20/2022  Consultations: ID: Dr. Juleen China 07/20/2022 Orthopedics: Dr. Marlou Sa 07/20/2022  Discharge Exam: Vitals:   07/21/22 1034 07/21/22 2104  BP: 115/83 125/85  Pulse: 93 80  Resp: 20 18  Temp: 97.6 F (36.4 C) 98.1 F (36.7 C)  SpO2: 100% 98%    General: Johnathan Brown left AMA Cardiovascular: Johnathan Brown left AMA Respiratory: Johnathan Brown left AMA  Discharge Instructions    Allergies as of 07/22/2022   No Known Allergies      Medication List     ASK your doctor about these medications    amphetamine-dextroamphetamine 20 MG tablet Commonly known as: ADDERALL Take 20 mg by mouth 2 (two) times daily.   buprenorphine 8 MG Subl SL tablet Commonly known as: SUBUTEX Place 8 mg under the tongue daily.   doxycycline 100 MG capsule Commonly known as: VIBRAMYCIN Take 1 capsule (100 mg total) by mouth 2 (two) times daily.       No Known Allergies    The results of significant diagnostics from this hospitalization (including imaging, microbiology, ancillary and laboratory) are listed below for reference.    Significant Diagnostic Studies: DG Knee Complete 4 Views Left  Result Date: 07/22/2022 CLINICAL DATA:  Left knee pain EXAM: LEFT KNEE - COMPLETE 4+ VIEW COMPARISON:  07/19/2022 FINDINGS: No acute fracture or dislocation is noted. Previously  seen prepatellar soft tissue swelling is no longer identified consistent with recent surgical intervention. No joint effusion is seen. IMPRESSION: No acute abnormality noted. Electronically Signed   By: Inez Catalina M.D.   On: 07/22/2022 10:48   DG Knee Complete 4 Views Left  Result Date: 07/19/2022 CLINICAL DATA:  Septic joint.  Left knee pain and infection EXAM: LEFT KNEE - COMPLETE 4+ VIEW COMPARISON:  06/27/2022 FINDINGS: No evidence of acute fracture or dislocation of the left knee. No focal bone lesion or bone destruction. No cortical erosion or sclerosis to suggest evidence of osteomyelitis. Joint spaces are normal. No significant effusion. Subcutaneous soft tissue swelling anterior to the patella and patellar ligament similar to prior study. No soft tissue gas or radiopaque foreign body demonstrated. IMPRESSION: 1. Soft tissue swelling anterior to the patella and patellar ligament. No significant effusion. 2. No acute bony abnormalities. Electronically Signed   By: Lucienne Capers M.D.   On: 07/19/2022 23:50   DG Knee Complete 4 Views Left  Result Date: 06/27/2022 CLINICAL DATA:  Left knee pain. EXAM: LEFT KNEE - COMPLETE 4+ VIEW COMPARISON:  Left knee radiograph dated 06/23/2022. FINDINGS: There is no acute fracture or dislocation. The bones are well mineralized. No arthritic changes. No joint effusion. There is diffuse thickening  of the soft tissue anterior to the patellar tendon. Focal raised area in the skin likely represent wound. No radiopaque foreign object or soft tissue gas. IMPRESSION: 1. No acute fracture or dislocation. 2. Skin wound and soft tissue thickening anterior to the patellar tendon. No soft tissue gas. Electronically Signed   By: Anner Crete M.D.   On: 06/27/2022 15:36   DG Knee Complete 4 Views Left  Result Date: 06/23/2022 CLINICAL DATA:  Cellulitis.  Pain. EXAM: LEFT KNEE - COMPLETE 4+ VIEW COMPARISON:  None Available. FINDINGS: Anterior soft tissue swelling  identified. No joint effusion. No fracture or dislocation. No degenerative change. No bony erosion. IMPRESSION: Anterior soft tissue swelling. No other abnormalities. Electronically Signed   By: Dorise Bullion III M.D.   On: 06/23/2022 14:03    Microbiology: Recent Results (from the past 240 hour(s))  Blood Culture (routine x 2)     Status: None (Preliminary result)   Collection Time: 07/19/22 10:43 PM   Specimen: BLOOD  Result Value Ref Range Status   Specimen Description   Final    BLOOD BLOOD LEFT FOREARM Performed at [facility] [address] [place], [place] [address]    Special Requests   Final    BOTTLES DRAWN AEROBIC AND ANAEROBIC Blood Culture adequate volume Performed at [facility] [address] [place], [place] [address]    Culture   Final    NO GROWTH 3 DAYS Performed at [facility], [place] [address] [place], [place] [address]    Report Status PENDING  Incomplete  Blood Culture (routine x 2)     Status: None (Preliminary result)   Collection Time: 07/19/22 11:30 PM   Specimen: BLOOD  Result Value Ref Range Status   Specimen Description   Final    BLOOD BLOOD LEFT HAND Performed at [facility] [address], [place], [place] [address]    Special Requests   Final    BOTTLES DRAWN AEROBIC AND ANAEROBIC Blood Culture adequate volume Performed at [facility] [address] [place], [place] [address]    Culture   Final    NO GROWTH 3 DAYS Performed at [facility], [place] [address], [place], [place] [address]    Report Status PENDING  Incomplete  Resp panel by RT-PCR (RSV, Flu A&B, Covid) Anterior Nasal Swab     Status: None   Collection Time: 07/20/22  1:06 AM   Specimen: Anterior Nasal Swab  Result Value Ref Range Status   SARS Coronavirus 2 by RT PCR NEGATIVE NEGATIVE Final    Comment: (NOTE) SARS-CoV-2 target nucleic acids are NOT DETECTED.  The SARS-CoV-2 RNA  is generally detectable in upper respiratory specimens during the acute phase of infection. The lowest concentration of SARS-CoV-2 viral copies this assay can detect is 138 copies/mL. A negative result does not preclude SARS-Cov-2 infection and should not be used as the sole basis for treatment or other Johnathan Brown management decisions. A negative result may occur with  improper specimen collection/handling, submission of specimen other than nasopharyngeal swab, presence of viral mutation(s) within the areas targeted by this assay, and inadequate number of viral copies(<138 copies/mL). A negative result must be combined with clinical observations, Johnathan Brown history, and epidemiological information. The expected result is Negative.  Fact Sheet for Patients:  EntrepreneurPulse.com.au  Fact Sheet for Healthcare Providers:  IncredibleEmployment.be  This test is no t yet approved or cleared by the [place] and  has been authorized for detection and/or diagnosis of SARS-CoV-2 by FDA under an Emergency Use Authorization (EUA). This EUA will remain  in effect (meaning this test can be used) for the duration of the COVID-19 declaration under Section 564(b)(1) of the Act, 21 U.S.C.section 360bbb-3(b)(1), unless the authorization is terminated  or revoked sooner.       Influenza A by PCR NEGATIVE NEGATIVE Final   Influenza B by PCR NEGATIVE NEGATIVE Final    Comment: (NOTE) The Xpert Xpress SARS-CoV-2/FLU/RSV plus assay is intended as an aid in the diagnosis of influenza from Nasopharyngeal swab specimens and should not be used as a sole basis for treatment. Nasal washings and aspirates are unacceptable for Xpert Xpress SARS-CoV-2/FLU/RSV testing.  Fact Sheet for Patients: EntrepreneurPulse.com.au  Fact Sheet for Healthcare Providers: IncredibleEmployment.be  This test is not yet approved or cleared by the  [place] FDA and has been authorized for detection and/or diagnosis of SARS-CoV-2 by FDA under an Emergency Use Authorization (EUA). This EUA will remain in effect (meaning this test can be used) for the duration of the COVID-19 declaration under Section 564(b)(1) of the Act, 21 U.S.C. section 360bbb-3(b)(1), unless the authorization is terminated or revoked.     Resp Syncytial Virus by PCR NEGATIVE NEGATIVE Final    Comment: (NOTE) Fact Sheet for Patients: EntrepreneurPulse.com.au  Fact Sheet for Healthcare Providers: IncredibleEmployment.be  This test is not yet approved or cleared by the [place] FDA and has been authorized for detection and/or diagnosis of SARS-CoV-2 by FDA under an Emergency Use Authorization (EUA). This EUA will remain in effect (meaning this test can be used) for the duration of the COVID-19 declaration under Section 564(b)(1) of the Act, 21 U.S.C. section 360bbb-3(b)(1), unless the authorization is terminated or revoked.  Performed at [facility], [place] [address] [place], [place] [address]   Aerobic/Anaerobic Culture w Gram Stain (surgical/deep wound)     Status: None (Preliminary result)   Collection Time: 07/20/22  5:00 PM   Specimen: PATH Other; Tissue  Result Value Ref Range Status   Specimen Description   Final    TISSUE PREPATELLAL BURSA Performed at [facility] [address], [place], [place] [address]    Special Requests   Final    NONE Performed at [facility], [place] [address], [place], [place] [address]    Gram Stain   Final    NO WBC SEEN NO ORGANISMS SEEN Performed at [facility], [place] [address], [place], [place] [address]    Culture   Final    FEW GROUP A STREP (S.PYOGENES) ISOLATED Beta hemolytic streptococci are predictably susceptible to penicillin and other beta lactams. Susceptibility testing not routinely  performed. NO ANAEROBES ISOLATED; CULTURE IN PROGRESS FOR 5 DAYS    Report Status PENDING  Incomplete  Aerobic/Anaerobic Culture w Gram Stain (surgical/deep wound)     Status: None (Preliminary result)   Collection Time: 07/20/22  5:02 PM   Specimen: PATH Other; Tissue  Result Value Ref Range Status   Specimen Description   Final    TISSUE PREPATELLA BURSA Performed at [facility], [place] [address], [place], [place] [address]    Special Requests   Final    NONE Performed at [facility], [place] [address] [place], [place] [address]    Gram Stain   Final    NO WBC SEEN FEW GRAM POSITIVE COCCI Performed at [facility], [place]  [address], [place], [place] [address]    Culture   Final    MODERATE GROUP A STREP (S.PYOGENES) ISOLATED Beta hemolytic streptococci are predictably susceptible to penicillin and other beta lactams. Susceptibility testing not routinely performed. CULTURE REINCUBATED FOR BETTER GROWTH NO ANAEROBES ISOLATED; CULTURE IN PROGRESS FOR 5 DAYS    Report Status PENDING  Incomplete     Labs: Basic Metabolic Panel: Recent Labs  Lab 07/19/22 2243 07/20/22 0525 07/21/22 0920 07/22/22 1029  NA 133* 130* 135 139  K 3.9 3.8 3.5 3.1*  CL 97* 99 102 107  CO2 25 22 24 25   GLUCOSE 107* 132* 148* 92  BUN 19 15 16 19   CREATININE 1.06 0.98 0.74 0.69  CALCIUM 8.7* 7.5* 8.0* 8.1*  MG  --   --  2.2  --    Liver Function Tests: Recent Labs  Lab 07/19/22 2243 07/22/22 1029  AST 23 18  ALT 16 14  ALKPHOS 96 72  BILITOT 0.9 0.3  PROT 8.2* 6.8  ALBUMIN 4.0 3.2*   No results for input(s): "LIPASE", "AMYLASE" in the last 168 hours. No results for input(s): "AMMONIA" in the last 168 hours. CBC: Recent Labs  Lab 07/19/22 2243 07/20/22 0525 07/21/22 0920 07/22/22 1029  WBC 20.9* 20.3* 16.6* 15.3*  NEUTROABS 18.3*  --  14.8* 12.3*  HGB 14.3 12.3* 12.0* 11.4*  HCT 44.2 38.1* 38.0* 34.7*  MCV 88.4 88.8 89.8 86.8  PLT 180  160 167 214   Cardiac Enzymes: No results for input(s): "CKTOTAL", "CKMB", "CKMBINDEX", "TROPONINI" in the last 168 hours. BNP: BNP (last 3 results) No results for input(s): "BNP" in the last 8760 hours.  ProBNP (last 3 results) No results for input(s): "PROBNP" in the last 8760 hours.  CBG: No results for input(s): "GLUCAP" in the last 168 hours.     Signed:  Irine Seal MD.  [facility] 07/23/2022, 8:24 AM

## 2022-07-23 NOTE — Discharge Summary (Signed)
Physician Discharge Summary   Patient: Johnathan Brown MRN: [id number] DOB: [birth year]  Admit date:     07/22/2022  Discharge date: 07/22/2022  Discharge Physician: Porcia Morganti Neva Seat   PCP: Patient, No Pcp Per   Recommendations at discharge: None  Discharge Diagnoses: Principal Problem:   Septic infrapatellar bursitis of left knee Active Problems:   Infection of left knee   Substance abuse   Cellulitis   Hyponatremia   Septic prepatellar bursitis of left knee   Tachycardia   Lactic acidosis   Hypokalemia  Resolved Problems:   * No resolved hospital problems. [facility] Course: He was admitted for continued IV abx for his septic left knee, but decided to leave AMA from the ER after admission was completed.  No instructions or prescriptions were given, other than to return to ER if needed.  Condition at discharge: Left AMA  The results of significant diagnostics from this hospitalization (including imaging, microbiology, ancillary and laboratory) are listed below for reference.   Imaging Studies: DG Knee Complete 4 Views Left  Result Date: 07/22/2022 CLINICAL DATA:  Left knee pain EXAM: LEFT KNEE - COMPLETE 4+ VIEW COMPARISON:  07/19/2022 FINDINGS: No acute fracture or dislocation is noted. Previously seen prepatellar soft tissue swelling is no longer identified consistent with recent surgical intervention. No joint effusion is seen. IMPRESSION: No acute abnormality noted. Electronically Signed   By: Inez Catalina M.D.   On: 07/22/2022 10:48   DG Knee Complete 4 Views Left  Result Date: 07/19/2022 CLINICAL DATA:  Septic joint.  Left knee pain and infection EXAM: LEFT KNEE - COMPLETE 4+ VIEW COMPARISON:  06/27/2022 FINDINGS: No evidence of acute fracture or dislocation of the left knee. No focal bone lesion or bone destruction. No cortical erosion or sclerosis to suggest evidence of osteomyelitis. Joint spaces are normal. No significant effusion. Subcutaneous soft tissue  swelling anterior to the patella and patellar ligament similar to prior study. No soft tissue gas or radiopaque foreign body demonstrated. IMPRESSION: 1. Soft tissue swelling anterior to the patella and patellar ligament. No significant effusion. 2. No acute bony abnormalities. Electronically Signed   By: Lucienne Capers M.D.   On: 07/19/2022 23:50   DG Knee Complete 4 Views Left  Result Date: 06/27/2022 CLINICAL DATA:  Left knee pain. EXAM: LEFT KNEE - COMPLETE 4+ VIEW COMPARISON:  Left knee radiograph dated 06/23/2022. FINDINGS: There is no acute fracture or dislocation. The bones are well mineralized. No arthritic changes. No joint effusion. There is diffuse thickening of the soft tissue anterior to the patellar tendon. Focal raised area in the skin likely represent wound. No radiopaque foreign object or soft tissue gas. IMPRESSION: 1. No acute fracture or dislocation. 2. Skin wound and soft tissue thickening anterior to the patellar tendon. No soft tissue gas. Electronically Signed   By: Anner Crete M.D.   On: 06/27/2022 15:36   DG Knee Complete 4 Views Left  Result Date: 06/23/2022 CLINICAL DATA:  Cellulitis.  Pain. EXAM: LEFT KNEE - COMPLETE 4+ VIEW COMPARISON:  None Available. FINDINGS: Anterior soft tissue swelling identified. No joint effusion. No fracture or dislocation. No degenerative change. No bony erosion. IMPRESSION: Anterior soft tissue swelling. No other abnormalities. Electronically Signed   By: Dorise Bullion III M.D.   On: 06/23/2022 14:03    Microbiology: Results for orders placed or performed during the hospital encounter of 07/19/22  Blood Culture (routine x 2)     Status: None (Preliminary result)   Collection Time:  07/19/22 10:43 PM   Specimen: BLOOD  Result Value Ref Range Status   Specimen Description   Final    BLOOD BLOOD LEFT FOREARM Performed at [facility] [address], [place], [place] [address]    Special Requests   Final    BOTTLES  DRAWN AEROBIC AND ANAEROBIC Blood Culture adequate volume Performed at [facility] [address] [place], [place] [address]    Culture   Final    NO GROWTH 3 DAYS Performed at [facility], [place] [address], [place], [place] [address]    Report Status PENDING  Incomplete  Blood Culture (routine x 2)     Status: None (Preliminary result)   Collection Time: 07/19/22 11:30 PM   Specimen: BLOOD  Result Value Ref Range Status   Specimen Description   Final    BLOOD BLOOD LEFT HAND Performed at [facility] [address], [place], [place] [address]    Special Requests   Final    BOTTLES DRAWN AEROBIC AND ANAEROBIC Blood Culture adequate volume Performed at [facility] [address], [place], [place] [address]    Culture   Final    NO GROWTH 3 DAYS Performed at [facility], [place] [address] [place], [place] [address]    Report Status PENDING  Incomplete  Resp panel by RT-PCR (RSV, Flu A&B, Covid) Anterior Nasal Swab     Status: None   Collection Time: 07/20/22  1:06 AM   Specimen: Anterior Nasal Swab  Result Value Ref Range Status   SARS Coronavirus 2 by RT PCR NEGATIVE NEGATIVE Final    Comment: (NOTE) SARS-CoV-2 target nucleic acids are NOT DETECTED.  The SARS-CoV-2 RNA is generally detectable in upper respiratory specimens during the acute phase of infection. The lowest concentration of SARS-CoV-2 viral copies this assay can detect is 138 copies/mL. A negative result does not preclude SARS-Cov-2 infection and should not be used as the sole basis for treatment or other patient management decisions. A negative result may occur with  improper specimen collection/handling, submission of specimen other than nasopharyngeal swab, presence of viral mutation(s) within the areas targeted by this assay, and inadequate number of viral copies(<138 copies/mL). A negative result must be combined with clinical  observations, patient history, and epidemiological information. The expected result is Negative.  Fact Sheet for Patients:  EntrepreneurPulse.com.au  Fact Sheet for Healthcare Providers:  IncredibleEmployment.be  This test is no t yet approved or cleared by the [place] FDA and  has been authorized for detection and/or diagnosis of SARS-CoV-2 by FDA under an Emergency Use Authorization (EUA). This EUA will remain  in effect (meaning this test can be used) for the duration of the COVID-19 declaration under Section 564(b)(1) of the Act, 21 U.S.C.section 360bbb-3(b)(1), unless the authorization is terminated  or revoked sooner.       Influenza A by PCR NEGATIVE NEGATIVE Final   Influenza B by PCR NEGATIVE NEGATIVE Final    Comment: (NOTE) The Xpert Xpress SARS-CoV-2/FLU/RSV plus assay is intended as an aid in the diagnosis of influenza from Nasopharyngeal swab specimens and should not be used as a sole basis for treatment. Nasal washings and aspirates are unacceptable for Xpert Xpress SARS-CoV-2/FLU/RSV testing.  Fact Sheet for Patients: EntrepreneurPulse.com.au  Fact Sheet for Healthcare Providers: IncredibleEmployment.be  This test is not yet approved or cleared by the [place] FDA and has been authorized for detection and/or diagnosis of SARS-CoV-2 by  FDA under an Emergency Use Authorization (EUA). This EUA will remain in effect (meaning this test can be used) for the duration of the COVID-19 declaration under Section 564(b)(1) of the Act, 21 U.S.C. section 360bbb-3(b)(1), unless the authorization is terminated or revoked.     Resp Syncytial Virus by PCR NEGATIVE NEGATIVE Final    Comment: (NOTE) Fact Sheet for Patients: EntrepreneurPulse.com.au  Fact Sheet for Healthcare Providers: IncredibleEmployment.be  This test is not yet approved or cleared by  the [place] FDA and has been authorized for detection and/or diagnosis of SARS-CoV-2 by FDA under an Emergency Use Authorization (EUA). This EUA will remain in effect (meaning this test can be used) for the duration of the COVID-19 declaration under Section 564(b)(1) of the Act, 21 U.S.C. section 360bbb-3(b)(1), unless the authorization is terminated or revoked.  Performed at [facility], [place] [address], [place], [place] [address]   Aerobic/Anaerobic Culture w Gram Stain (surgical/deep wound)     Status: None (Preliminary result)   Collection Time: 07/20/22  5:00 PM   Specimen: PATH Other; Tissue  Result Value Ref Range Status   Specimen Description   Final    TISSUE PREPATELLAL BURSA Performed at [facility] [address] [place], [place] [address]    Special Requests   Final    NONE Performed at [facility], [place] [address] [place], [place] [address]    Gram Stain   Final    NO WBC SEEN NO ORGANISMS SEEN Performed at [facility], [place] [address] [place], [place] [address]    Culture   Final    FEW GROUP A STREP (S.PYOGENES) ISOLATED Beta hemolytic streptococci are predictably susceptible to penicillin and other beta lactams. Susceptibility testing not routinely performed. NO ANAEROBES ISOLATED; CULTURE IN PROGRESS FOR 5 DAYS    Report Status PENDING  Incomplete  Aerobic/Anaerobic Culture w Gram Stain (surgical/deep wound)     Status: None (Preliminary result)   Collection Time: 07/20/22  5:02 PM   Specimen: PATH Other; Tissue  Result Value Ref Range Status   Specimen Description   Final    TISSUE PREPATELLA BURSA Performed at [facility], [place] [address], [place], [place] [address]    Special Requests   Final    NONE Performed at [facility], [place] [address], [place], [place] [address]    Gram Stain   Final    NO WBC SEEN FEW GRAM POSITIVE  COCCI Performed at [facility], [place] [address], [place], [place] [address]    Culture   Final    MODERATE GROUP A STREP (S.PYOGENES) ISOLATED Beta hemolytic streptococci are predictably susceptible to penicillin and other beta lactams. Susceptibility testing not routinely performed. CULTURE REINCUBATED FOR BETTER GROWTH NO ANAEROBES ISOLATED; CULTURE IN PROGRESS FOR 5 DAYS    Report Status PENDING  Incomplete    Labs: CBC: Recent Labs  Lab 07/19/22 2243 07/20/22 0525 07/21/22 0920 07/22/22 1029  WBC 20.9* 20.3* 16.6* 15.3*  NEUTROABS 18.3*  --  14.8* 12.3*  HGB 14.3 12.3* 12.0* 11.4*  HCT 44.2 38.1* 38.0* 34.7*  MCV 88.4 88.8 89.8 86.8  PLT 180 160 167 [id number]   Basic Metabolic Panel: Recent Labs  Lab 07/19/22 2243 07/20/22 0525 07/21/22 0920 07/22/22 1029  NA 133* 130* 135 139  K 3.9 3.8 3.5 3.1*  CL 97* 99 102 107  CO2 25 22 24 25   GLUCOSE 107* 132* 148* 92  BUN 19 15 16 19   CREATININE 1.06 0.98 0.74 0.69  CALCIUM 8.7* 7.5* 8.0* 8.1*  MG  --   --  2.2  --    Liver Function Tests: Recent Labs  Lab 07/19/22 2243 07/22/22 1029  AST 23 18  ALT 16 14  ALKPHOS 96 72  BILITOT 0.9 0.3  PROT 8.2* 6.8  ALBUMIN 4.0 3.2*   CBG: No results for input(s): "GLUCAP" in the last 168 hours.  Discharge time spent: less than 30 minutes.  Signed: Quinta Eimer Neva Seat, MD [facility] 07/23/2022

## 2022-07-24 ENCOUNTER — Emergency Department (HOSPITAL_COMMUNITY)
Admission: EM | Admit: 2022-07-24 | Discharge: 2022-07-24 | Disposition: A | Discharge status: Home / Self Care | Payer: 59 | Attending: Emergency Medicine | Admitting: Emergency Medicine

## 2022-07-24 ENCOUNTER — Encounter (HOSPITAL_COMMUNITY): Payer: Self-pay | Admitting: Emergency Medicine

## 2022-07-24 ENCOUNTER — Emergency Department (HOSPITAL_COMMUNITY): Payer: 59

## 2022-07-24 ENCOUNTER — Other Ambulatory Visit: Payer: Self-pay

## 2022-07-24 DIAGNOSIS — M7989 Other specified soft tissue disorders: Secondary | ICD-10-CM | POA: Diagnosis present

## 2022-07-24 DIAGNOSIS — F17200 Nicotine dependence, unspecified, uncomplicated: Secondary | ICD-10-CM | POA: Insufficient documentation

## 2022-07-24 DIAGNOSIS — L03116 Cellulitis of left lower limb: Secondary | ICD-10-CM | POA: Insufficient documentation

## 2022-07-24 DIAGNOSIS — B9689 Other specified bacterial agents as the cause of diseases classified elsewhere: Secondary | ICD-10-CM

## 2022-07-24 LAB — CBC WITH DIFFERENTIAL/PLATELET
Abs Immature Granulocytes: 0.67 10*3/uL — ABNORMAL HIGH (ref 0.00–0.07)
Basophils Absolute: 0.1 10*3/uL (ref 0.0–0.1)
Basophils Relative: 1 %
Eosinophils Absolute: 0.3 10*3/uL (ref 0.0–0.5)
Eosinophils Relative: 4 %
HCT: 34.6 % — ABNORMAL LOW (ref 39.0–52.0)
Hemoglobin: 11.3 g/dL — ABNORMAL LOW (ref 13.0–17.0)
Immature Granulocytes: 7 %
Lymphocytes Relative: 23 %
Lymphs Abs: 2.2 10*3/uL (ref 0.7–4.0)
MCH: 28.5 pg (ref 26.0–34.0)
MCHC: 32.7 g/dL (ref 30.0–36.0)
MCV: 87.2 fL (ref 80.0–100.0)
Monocytes Absolute: 0.8 10*3/uL (ref 0.1–1.0)
Monocytes Relative: 8 %
Neutro Abs: 5.7 10*3/uL (ref 1.7–7.7)
Neutrophils Relative %: 57 %
Platelets: 226 10*3/uL (ref 150–400)
RBC: 3.97 MIL/uL — ABNORMAL LOW (ref 4.22–5.81)
RDW: 14.5 % (ref 11.5–15.5)
WBC Morphology: ABNORMAL
WBC: 9.8 10*3/uL (ref 4.0–10.5)
nRBC: 0 % (ref 0.0–0.2)

## 2022-07-24 LAB — COMPREHENSIVE METABOLIC PANEL
ALT: 17 U/L (ref 0–44)
AST: 22 U/L (ref 15–41)
Albumin: 3.1 g/dL — ABNORMAL LOW (ref 3.5–5.0)
Alkaline Phosphatase: 99 U/L (ref 38–126)
Anion gap: 8 (ref 5–15)
BUN: 15 mg/dL (ref 6–20)
CO2: 25 mmol/L (ref 22–32)
Calcium: 8.1 mg/dL — ABNORMAL LOW (ref 8.9–10.3)
Chloride: 103 mmol/L (ref 98–111)
Creatinine, Ser: 0.72 mg/dL (ref 0.61–1.24)
GFR, Estimated: >60 mL/min (ref >60)
Glucose, Bld: 92 mg/dL (ref 70–99)
Potassium: 3 mmol/L — ABNORMAL LOW (ref 3.5–5.1)
Sodium: 136 mmol/L (ref 135–145)
Total Bilirubin: 0.5 mg/dL (ref 0.3–1.2)
Total Protein: 6.5 g/dL (ref 6.5–8.1)

## 2022-07-24 LAB — URINALYSIS, ROUTINE W REFLEX MICROSCOPIC
Bilirubin Urine: NEGATIVE
Glucose, UA: NEGATIVE mg/dL
Hgb urine dipstick: NEGATIVE
Ketones, ur: NEGATIVE mg/dL
Leukocytes,Ua: NEGATIVE
Nitrite: NEGATIVE
Protein, ur: NEGATIVE mg/dL
Specific Gravity, Urine: 1.009 (ref 1.005–1.030)
pH: 6 (ref 5.0–8.0)

## 2022-07-24 LAB — LACTIC ACID, PLASMA
Lactic Acid, Venous: 1.2 mmol/L (ref 0.5–1.9)
Lactic Acid, Venous: 1.5 mmol/L (ref 0.5–1.9)

## 2022-07-24 MED ORDER — AMOXICILLIN-POT CLAVULANATE 875-125 MG PO TABS: 1.0000 | ORAL_TABLET | Freq: Two times a day (BID) | ORAL | 0 refills | Status: DC

## 2022-07-24 MED ORDER — AMOXICILLIN-POT CLAVULANATE 875-125 MG PO TABS
1.0000 | ORAL_TABLET | Freq: Once | ORAL | Status: AC
  Administered 2022-07-24: 1 via ORAL
  Filled 2022-07-24: qty 1

## 2022-07-24 MED ORDER — AMOXICILLIN-POT CLAVULANATE 875-125 MG PO TABS: 1.0000 | ORAL_TABLET | Freq: Two times a day (BID) | ORAL | 0 refills | Status: AC

## 2022-07-24 MED ORDER — DEXTROSE 5 % IV SOLN
1500.0000 mg | Freq: Once | INTRAVENOUS | Status: AC
  Administered 2022-07-24: 1500 mg via INTRAVENOUS
  Filled 2022-07-24: qty 75

## 2022-07-24 NOTE — ED Triage Notes (Signed)
Pt in with pain and increased swelling to L knee, had surgery for septic prepatellar bursitis of L knee on 3/31. Pt states he left AMA and feels his L leg looks worse. Redness, warmth and swelling noted. Not currently on any PO abx

## 2022-07-24 NOTE — ED Provider Notes (Signed)
[place] [facility] Provider Note  CSN: [id number] Arrival date & time: 07/24/22 [id number]  Chief Complaint(s) Leg Swelling  HPI Johnathan Brown is a 47 y.o. male with a past medical history listed below including septic bursitis of the left knee requiring surgical bursa irrigation and debridement as well as IV antibiotics who left AGAINST MEDICAL ADVICE 2 days ago.  He returns today for continued leg pain with erythema and swelling.  No real ported fall or trauma.  Patient states that he is willing to stay in the hospital if needed.  HPI  Past Medical History Past Medical History:  Diagnosis Date   Abscess 01/22/2015   ADHD    GERD (gastroesophageal reflux disease)    Patient Active Problem List   Diagnosis Date Noted   Tachycardia 07/22/2022   Lactic acidosis 07/22/2022   Septic infrapatellar bursitis of left knee 07/22/2022   Hypokalemia 07/22/2022   Septic prepatellar bursitis of left knee 07/21/2022   Infection of left knee 07/20/2022   Hyponatremia 07/20/2022   Sepsis without acute organ dysfunction 07/20/2022   Severe sepsis 07/20/2022   Cellulitis 06/23/2022   Abscess of right upper extremity 01/25/2015   Chronic low back pain 01/25/2015   Substance abuse 01/25/2015   Tobacco abuse 01/25/2015   Abscess of upper extremity 01/25/2015   Abscess of arm, right    Right arm cellulitis    Home Medication(s) Prior to Admission medications   Medication Sig Start Date End Date Taking? Authorizing Provider  amoxicillin-clavulanate (AUGMENTIN) 875-125 MG tablet Take 1 tablet by mouth every 12 (twelve) hours. 07/24/22 08/23/22 Yes Sachit Gilman, Grayce Sessions, MD  amphetamine-dextroamphetamine (ADDERALL) 20 MG tablet Take 20 mg by mouth 2 (two) times daily.   Yes [provider]  buprenorphine (SUBUTEX) 8 MG SUBL SL tablet Place 8 mg under the tongue daily.   Yes [provider]                                                                                                                                     Allergies Patient has no known allergies.  Review of Systems Review of Systems As noted in HPI  Physical Exam Vital Signs  I have reviewed the triage vital signs BP 113/74   Pulse 64   Temp 98.3 F (36.8 C) (Oral)   Resp 18   Wt 77.1 kg   SpO2 100%   BMI 23.71 kg/m   Physical Exam Vitals reviewed.  Constitutional:      General: He is not in acute distress.    Appearance: He is well-developed. He is not diaphoretic.  HENT:     Head: Normocephalic and atraumatic.     Right Ear: External ear normal.     Left Ear: External ear normal.     Nose: Nose normal.     Mouth/Throat:     Mouth: Mucous membranes are moist.  Eyes:     General: No scleral icterus.    Conjunctiva/sclera: Conjunctivae normal.  Neck:     Trachea: Phonation normal.  Cardiovascular:     Rate and Rhythm: Normal rate and regular rhythm.  Pulmonary:     Effort: Pulmonary effort is normal. No respiratory distress.     Breath sounds: No stridor.  Abdominal:     General: There is no distension.  Musculoskeletal:        General: Normal range of motion.     Cervical back: Normal range of motion.     Left lower leg: Swelling and tenderness present. Edema present.       Legs:  Neurological:     Mental Status: He is alert and oriented to person, place, and time.  Psychiatric:        Behavior: Behavior normal.     ED Results and Treatments Labs (all labs ordered are listed, but only abnormal results are displayed) Labs Reviewed  COMPREHENSIVE METABOLIC PANEL - Abnormal; Notable for the following components:      Result Value   Potassium 3.0 (*)    Calcium 8.1 (*)    Albumin 3.1 (*)    All other components within normal limits  CBC WITH DIFFERENTIAL/PLATELET - Abnormal; Notable for the following components:   RBC 3.97 (*)    Hemoglobin 11.3 (*)    HCT 34.6 (*)    Abs Immature Granulocytes 0.67 (*)    All other components  within normal limits  URINALYSIS, ROUTINE W REFLEX MICROSCOPIC - Abnormal; Notable for the following components:   Color, Urine STRAW (*)    All other components within normal limits  LACTIC ACID, PLASMA  LACTIC ACID, PLASMA  PATHOLOGIST SMEAR REVIEW                                                                                                                         EKG  EKG Interpretation  Date/Time:    Ventricular Rate:    PR Interval:    QRS Duration:   QT Interval:    QTC Calculation:   R Axis:     Text Interpretation:         Radiology No results found.  Medications Ordered in ED Medications  dalbavancin (DALVANCE) 1,500 mg in dextrose 5 % 500 mL IVPB (0 mg Intravenous Stopped 07/24/22 0550)  amoxicillin-clavulanate (AUGMENTIN) 875-125 MG per tablet 1 tablet (1 tablet Oral Given 07/24/22 0627)  Procedures Procedures  (including critical care time)  Medical Decision Making / ED Course  Click here for ABCD2, HEART and other calculators  Medical Decision Making Amount and/or Complexity of Data Reviewed External Data Reviewed: notes.    Details: Admission note detailed above. ID consult note mentioned that if patient could receive Dalvance and 4 weeks of Augmentin if he still decided to leave AMA.  Patient did not receive this medicine prior to leaving the hospital Labs: ordered. Decision-making details documented in ED Course. Radiology: ordered.  Risk Prescription drug management. Decision regarding hospitalization.    This patient presents to the ED for concern of leg pain and swelling, this involves an extensive number of treatment options, and is a complaint that carries with it a high risk of complications and morbidity. The differential diagnosis includes but not limited to persistent cellulitis/bursitis.   Work up: [employer]  and/or imaging independently interpreted by me) CBC without leukocytosis.  Mild anemia. Metabolic panel without significant electrolyte derangements or renal sufficiency. Lactic acid normal  Reassessment: Patient is well-appearing and nontoxic. Offered admission versus receiving Dalvance and 4 weeks of Augmentin.  Patient opted for the latter. Given dose of Dalvance and Augmentin Patient to follow-up with infectious disease and orthopedic surgery       Final Clinical Impression(s) / ED Diagnoses Final diagnoses:  Cellulitis of left lower extremity   The patient appears reasonably screened and/or stabilized for discharge and I doubt any other medical condition or other [facility] requiring further screening, evaluation, or treatment in the ED at this time. I have discussed the findings, Dx and Tx plan with the patient/family who expressed understanding and agree(s) with the plan. Discharge instructions discussed at length. The patient/family was given strict return precautions who verbalized understanding of the instructions. No further questions at time of discharge.  Disposition: Discharge  Condition: Good  ED Discharge Orders          Ordered    Ambulatory referral to Infectious Disease       Comments: Cellulitis patient:  Received dalbavancin on 07/24/2022.   07/24/22 0437    amoxicillin-clavulanate (AUGMENTIN) 875-125 MG tablet  Every 12 hours        07/24/22 0656             Follow Up: Meredith Pel, [place] [address] [phone]  Call  to schedule an appointment for close follow up            This chart was dictated using voice recognition software.  Despite best efforts to proofread,  errors can occur which can change the documentation meaning.    Fatima Blank, MD 07/24/22 [phone]

## 2022-07-24 NOTE — ED Notes (Signed)
1 set of blood cultures sent down to lab at this time

## 2022-07-24 NOTE — ED Notes (Signed)
Patient has a urine culture in the main lab 

## 2022-07-25 LAB — CULTURE, BLOOD (ROUTINE X 2)
Culture: NO GROWTH
Culture: NO GROWTH
Special Requests: ADEQUATE
Special Requests: ADEQUATE

## 2022-07-25 LAB — AEROBIC/ANAEROBIC CULTURE W GRAM STAIN (SURGICAL/DEEP WOUND)
Gram Stain: NONE SEEN
Gram Stain: NONE SEEN

## 2022-08-01 ENCOUNTER — Telehealth: Payer: Self-pay | Admitting: Orthopedic Surgery

## 2022-08-01 NOTE — Telephone Encounter (Signed)
IC LMVM advising was returning call to schedule.

## 2022-08-01 NOTE — Telephone Encounter (Signed)
Patient's sister called. Patient needs to be seen this week to get stiches out she says. Her call back number is [phone]

## 2023-09-26 ENCOUNTER — Emergency Department

## 2023-09-26 ENCOUNTER — Emergency Department: Admission: EM | Admit: 2023-09-26 | Discharge: 2023-09-26 | Discharge status: Against Medical Advice

## 2023-09-26 ENCOUNTER — Other Ambulatory Visit: Payer: Self-pay

## 2023-09-26 DIAGNOSIS — M25531 Pain in right wrist: Secondary | ICD-10-CM | POA: Diagnosis present

## 2023-09-26 DIAGNOSIS — Y92410 Unspecified street and highway as the place of occurrence of the external cause: Secondary | ICD-10-CM | POA: Diagnosis not present

## 2023-09-26 DIAGNOSIS — Z5321 Procedure and treatment not carried out due to patient leaving prior to being seen by health care provider: Secondary | ICD-10-CM | POA: Insufficient documentation
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
## 2023-09-26 NOTE — ED Notes (Signed)
 Patient called x3 with no answer from lobby. Pt not visualized.

## 2024-01-12 ENCOUNTER — Ambulatory Visit (HOSPITAL_COMMUNITY)
Admission: EM | Admit: 2024-01-12 | Discharge: 2024-01-13 | Disposition: A | Discharge status: Other Acute Inpatient Hospital

## 2024-01-12 DIAGNOSIS — R519 Headache, unspecified: Secondary | ICD-10-CM | POA: Insufficient documentation

## 2024-01-12 DIAGNOSIS — S0083XA Contusion of other part of head, initial encounter: Secondary | ICD-10-CM | POA: Insufficient documentation

## 2024-01-12 DIAGNOSIS — S301XXA Contusion of abdominal wall, initial encounter: Secondary | ICD-10-CM | POA: Insufficient documentation

## 2024-01-12 DIAGNOSIS — Y92410 Unspecified street and highway as the place of occurrence of the external cause: Secondary | ICD-10-CM | POA: Insufficient documentation

## 2024-01-12 DIAGNOSIS — M545 Low back pain, unspecified: Secondary | ICD-10-CM | POA: Insufficient documentation

## 2024-01-12 DIAGNOSIS — M549 Dorsalgia, unspecified: Secondary | ICD-10-CM
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
## 2024-01-13 ENCOUNTER — Other Ambulatory Visit: Payer: Self-pay

## 2024-01-13 ENCOUNTER — Inpatient Hospital Stay (HOSPITAL_COMMUNITY)
Admission: EM | Admit: 2024-01-13 | Discharge: 2024-01-15 | DRG: 896 | Disposition: A | Discharge status: Home / Self Care | Source: Intra-hospital | Attending: Pulmonary Disease | Admitting: Pulmonary Disease

## 2024-01-13 ENCOUNTER — Emergency Department (HOSPITAL_COMMUNITY)

## 2024-01-13 ENCOUNTER — Encounter (HOSPITAL_COMMUNITY): Payer: Self-pay

## 2024-01-13 DIAGNOSIS — F1190 Opioid use, unspecified, uncomplicated: Secondary | ICD-10-CM | POA: Diagnosis present

## 2024-01-13 DIAGNOSIS — F19951 Other psychoactive substance use, unspecified with psychoactive substance-induced psychotic disorder with hallucinations: Secondary | ICD-10-CM | POA: Diagnosis present

## 2024-01-13 DIAGNOSIS — R443 Hallucinations, unspecified: Secondary | ICD-10-CM | POA: Diagnosis present

## 2024-01-13 DIAGNOSIS — Z8249 Family history of ischemic heart disease and other diseases of the circulatory system: Secondary | ICD-10-CM

## 2024-01-13 DIAGNOSIS — F17210 Nicotine dependence, cigarettes, uncomplicated: Secondary | ICD-10-CM | POA: Diagnosis present

## 2024-01-13 DIAGNOSIS — Z781 Physical restraint status: Secondary | ICD-10-CM | POA: Diagnosis not present

## 2024-01-13 DIAGNOSIS — S0081XA Abrasion of other part of head, initial encounter: Secondary | ICD-10-CM | POA: Diagnosis present

## 2024-01-13 DIAGNOSIS — F1910 Other psychoactive substance abuse, uncomplicated: Secondary | ICD-10-CM | POA: Diagnosis present

## 2024-01-13 DIAGNOSIS — Z833 Family history of diabetes mellitus: Secondary | ICD-10-CM | POA: Diagnosis not present

## 2024-01-13 DIAGNOSIS — F909 Attention-deficit hyperactivity disorder, unspecified type: Secondary | ICD-10-CM | POA: Diagnosis present

## 2024-01-13 DIAGNOSIS — R4689 Other symptoms and signs involving appearance and behavior: Principal | ICD-10-CM

## 2024-01-13 DIAGNOSIS — Y92410 Unspecified street and highway as the place of occurrence of the external cause: Secondary | ICD-10-CM | POA: Diagnosis not present

## 2024-01-13 DIAGNOSIS — L0390 Cellulitis, unspecified: Secondary | ICD-10-CM | POA: Diagnosis present

## 2024-01-13 DIAGNOSIS — F15959 Other stimulant use, unspecified with stimulant-induced psychotic disorder, unspecified: Secondary | ICD-10-CM | POA: Diagnosis present

## 2024-01-13 DIAGNOSIS — R748 Abnormal levels of other serum enzymes: Secondary | ICD-10-CM | POA: Diagnosis present

## 2024-01-13 DIAGNOSIS — S0001XA Abrasion of scalp, initial encounter: Secondary | ICD-10-CM | POA: Diagnosis present

## 2024-01-13 DIAGNOSIS — G9341 Metabolic encephalopathy: Secondary | ICD-10-CM | POA: Diagnosis present

## 2024-01-13 DIAGNOSIS — F1490 Cocaine use, unspecified, uncomplicated: Secondary | ICD-10-CM | POA: Diagnosis present

## 2024-01-13 DIAGNOSIS — Z653 Problems related to other legal circumstances: Secondary | ICD-10-CM

## 2024-01-13 DIAGNOSIS — M545 Low back pain, unspecified: Secondary | ICD-10-CM | POA: Diagnosis present

## 2024-01-13 DIAGNOSIS — R001 Bradycardia, unspecified: Secondary | ICD-10-CM | POA: Diagnosis present

## 2024-01-13 DIAGNOSIS — E876 Hypokalemia: Secondary | ICD-10-CM | POA: Diagnosis present

## 2024-01-13 DIAGNOSIS — Z23 Encounter for immunization: Secondary | ICD-10-CM | POA: Diagnosis not present

## 2024-01-13 DIAGNOSIS — S0083XA Contusion of other part of head, initial encounter: Secondary | ICD-10-CM | POA: Diagnosis present

## 2024-01-13 LAB — URINALYSIS, ROUTINE W REFLEX MICROSCOPIC
Bilirubin Urine: NEGATIVE
Glucose, UA: NEGATIVE mg/dL
Hgb urine dipstick: NEGATIVE
Ketones, ur: NEGATIVE mg/dL
Leukocytes,Ua: NEGATIVE
Nitrite: NEGATIVE
Protein, ur: NEGATIVE mg/dL
Specific Gravity, Urine: 1.034 — ABNORMAL HIGH (ref 1.005–1.030)
pH: 8 (ref 5.0–8.0)

## 2024-01-13 LAB — COMPREHENSIVE METABOLIC PANEL WITH GFR
ALT: 14 U/L (ref 0–44)
AST: 32 U/L (ref 15–41)
Albumin: 4.3 g/dL (ref 3.5–5.0)
Alkaline Phosphatase: 108 U/L (ref 38–126)
Anion gap: 13 (ref 5–15)
BUN: 14 mg/dL (ref 6–20)
CO2: 23 mmol/L (ref 22–32)
Calcium: 9.3 mg/dL (ref 8.9–10.3)
Chloride: 105 mmol/L (ref 98–111)
Creatinine, Ser: 0.79 mg/dL (ref 0.61–1.24)
GFR, Estimated: >60 mL/min (ref >60)
Glucose, Bld: 128 mg/dL — ABNORMAL HIGH (ref 70–99)
Potassium: 3.9 mmol/L (ref 3.5–5.1)
Sodium: 140 mmol/L (ref 135–145)
Total Bilirubin: 0.4 mg/dL (ref 0.0–1.2)
Total Protein: 7 g/dL (ref 6.5–8.1)

## 2024-01-13 LAB — CBC
HCT: 41.4 % (ref 39.0–52.0)
Hemoglobin: 13.4 g/dL (ref 13.0–17.0)
MCH: 27.2 pg (ref 26.0–34.0)
MCHC: 32.4 g/dL (ref 30.0–36.0)
MCV: 84.1 fL (ref 80.0–100.0)
Platelets: 231 K/uL (ref 150–400)
RBC: 4.92 MIL/uL (ref 4.22–5.81)
RDW: 13.6 % (ref 11.5–15.5)
WBC: 13.7 K/uL — ABNORMAL HIGH (ref 4.0–10.5)
nRBC: 0 % (ref 0.0–0.2)

## 2024-01-13 LAB — CBC WITH DIFFERENTIAL/PLATELET
Abs Immature Granulocytes: 0.03 K/uL (ref 0.00–0.07)
Basophils Absolute: 0 K/uL (ref 0.0–0.1)
Basophils Relative: 0 %
Eosinophils Absolute: 0.1 K/uL (ref 0.0–0.5)
Eosinophils Relative: 1 %
HCT: 40.3 % (ref 39.0–52.0)
Hemoglobin: 13 g/dL (ref 13.0–17.0)
Immature Granulocytes: 0 %
Lymphocytes Relative: 15 %
Lymphs Abs: 1.4 K/uL (ref 0.7–4.0)
MCH: 27.3 pg (ref 26.0–34.0)
MCHC: 32.3 g/dL (ref 30.0–36.0)
MCV: 84.5 fL (ref 80.0–100.0)
Monocytes Absolute: 0.7 K/uL (ref 0.1–1.0)
Monocytes Relative: 8 %
Neutro Abs: 7 K/uL (ref 1.7–7.7)
Neutrophils Relative %: 76 %
Platelets: 218 K/uL (ref 150–400)
RBC: 4.77 MIL/uL (ref 4.22–5.81)
RDW: 13.7 % (ref 11.5–15.5)
WBC: 9.2 K/uL (ref 4.0–10.5)
nRBC: 0 % (ref 0.0–0.2)

## 2024-01-13 LAB — CREATININE, SERUM
Creatinine, Ser: 0.76 mg/dL (ref 0.61–1.24)
GFR, Estimated: >60 mL/min (ref >60)

## 2024-01-13 LAB — URINE DRUG SCREEN
Amphetamines: POSITIVE — AB
Barbiturates: NEGATIVE
Benzodiazepines: NEGATIVE
Cocaine: POSITIVE — AB
Fentanyl: POSITIVE — AB
Methadone Scn, Ur: NEGATIVE
Opiates: NEGATIVE
Tetrahydrocannabinol: NEGATIVE

## 2024-01-13 LAB — MRSA NEXT GEN BY PCR, NASAL: MRSA by PCR Next Gen: NOT DETECTED

## 2024-01-13 LAB — HIV ANTIBODY (ROUTINE TESTING W REFLEX): HIV Screen 4th Generation wRfx: NONREACTIVE

## 2024-01-13 LAB — ETHANOL: Alcohol, Ethyl (B): <15 mg/dL (ref <15)

## 2024-01-13 LAB — CK
Total CK: 759 U/L — ABNORMAL HIGH (ref 49–397)
Total CK: 1213 U/L — ABNORMAL HIGH (ref 49–397)

## 2024-01-13 MED ORDER — TETANUS-DIPHTH-ACELL PERTUSSIS 5-2.5-18.5 LF-MCG/0.5 IM SUSY
0.5000 mL | PREFILLED_SYRINGE | Freq: Once | INTRAMUSCULAR | Status: AC
  Administered 2024-01-13: 0.5 mL via INTRAMUSCULAR
  Filled 2024-01-13: qty 0.5

## 2024-01-13 MED ORDER — POLYETHYLENE GLYCOL 3350 17 G PO PACK: 17.0000 g | PACK | Freq: Every day | ORAL | Status: DC | PRN

## 2024-01-13 MED ORDER — STERILE WATER FOR INJECTION IJ SOLN
INTRAMUSCULAR | Status: AC
  Filled 2024-01-13: qty 10

## 2024-01-13 MED ORDER — HALOPERIDOL LACTATE 5 MG/ML IJ SOLN
5.0000 mg | Freq: Four times a day (QID) | INTRAMUSCULAR | Status: DC | PRN
  Administered 2024-01-13 – 2024-01-15 (×2): 5 mg via INTRAVENOUS
  Filled 2024-01-13 (×2): qty 1

## 2024-01-13 MED ORDER — LORAZEPAM 2 MG/ML IJ SOLN
2.0000 mg | Freq: Once | INTRAMUSCULAR | Status: AC
  Administered 2024-01-13: 2 mg via INTRAMUSCULAR
  Filled 2024-01-13: qty 1

## 2024-01-13 MED ORDER — LACTATED RINGERS IV SOLN: INTRAVENOUS | Status: AC

## 2024-01-13 MED ORDER — ZIPRASIDONE MESYLATE 20 MG IM SOLR: 20.0000 mg | Freq: Once | INTRAMUSCULAR | Status: AC

## 2024-01-13 MED ORDER — DROPERIDOL 2.5 MG/ML IJ SOLN
2.5000 mg | Freq: Once | INTRAMUSCULAR | Status: AC
  Administered 2024-01-13: 2.5 mg via INTRAVENOUS
  Filled 2024-01-13: qty 2

## 2024-01-13 MED ORDER — INFLUENZA VIRUS VACC SPLIT PF (FLUZONE) 0.5 ML IM SUSY
0.5000 mL | PREFILLED_SYRINGE | INTRAMUSCULAR | Status: DC
  Filled 2024-01-13: qty 0.5

## 2024-01-13 MED ORDER — ORAL CARE MOUTH RINSE: 15.0000 mL | OROMUCOSAL | Status: DC | PRN

## 2024-01-13 MED ORDER — ENOXAPARIN SODIUM 40 MG/0.4ML IJ SOSY
40.0000 mg | PREFILLED_SYRINGE | INTRAMUSCULAR | Status: DC
  Administered 2024-01-14 – 2024-01-15 (×2): 40 mg via SUBCUTANEOUS
  Filled 2024-01-13 (×2): qty 0.4

## 2024-01-13 MED ORDER — DOCUSATE SODIUM 100 MG PO CAPS: 100.0000 mg | ORAL_CAPSULE | Freq: Two times a day (BID) | ORAL | Status: DC | PRN

## 2024-01-13 MED ORDER — ZIPRASIDONE MESYLATE 20 MG IM SOLR
INTRAMUSCULAR | Status: AC
  Administered 2024-01-13: 20 mg via INTRAMUSCULAR
  Filled 2024-01-13: qty 20

## 2024-01-13 MED ORDER — IBUPROFEN 200 MG PO TABS
600.0000 mg | ORAL_TABLET | Freq: Once | ORAL | Status: AC
  Administered 2024-01-13: 600 mg via ORAL
  Filled 2024-01-13: qty 3

## 2024-01-13 MED ORDER — ZIPRASIDONE MESYLATE 20 MG IM SOLR
20.0000 mg | Freq: Once | INTRAMUSCULAR | Status: AC
  Administered 2024-01-13: 20 mg via INTRAMUSCULAR
  Filled 2024-01-13: qty 20

## 2024-01-13 MED ORDER — DEXMEDETOMIDINE HCL IN NACL 400 MCG/100ML IV SOLN
0.0000 ug/kg/h | INTRAVENOUS | Status: DC
  Administered 2024-01-13: 1.4 ug/kg/h via INTRAVENOUS
  Administered 2024-01-13: 1 ug/kg/h via INTRAVENOUS
  Administered 2024-01-13 (×3): 1.4 ug/kg/h via INTRAVENOUS
  Administered 2024-01-14: 0.5 ug/kg/h via INTRAVENOUS
  Administered 2024-01-14: 1.4 ug/kg/h via INTRAVENOUS
  Administered 2024-01-14 (×2): 1.3 ug/kg/h via INTRAVENOUS
  Administered 2024-01-15: 0.4 ug/kg/h via INTRAVENOUS
  Filled 2024-01-13 (×8): qty 100

## 2024-01-13 MED ORDER — PHENOBARBITAL SODIUM 130 MG/ML IJ SOLN
130.0000 mg | Freq: Once | INTRAMUSCULAR | Status: AC
  Administered 2024-01-13: 130 mg via INTRAVENOUS
  Filled 2024-01-13: qty 1

## 2024-01-13 MED ORDER — DIAZEPAM 5 MG/ML IJ SOLN
5.0000 mg | Freq: Once | INTRAMUSCULAR | Status: AC
Start: 2024-01-13 — End: 2024-01-13
  Administered 2024-01-13: 5 mg via INTRAMUSCULAR
  Filled 2024-01-13: qty 2

## 2024-01-13 MED ORDER — CHLORHEXIDINE GLUCONATE CLOTH 2 % EX PADS
6.0000 | MEDICATED_PAD | Freq: Every day | CUTANEOUS | Status: DC
  Administered 2024-01-14 – 2024-01-15 (×2): 6 via TOPICAL

## 2024-01-13 MED ORDER — DIPHENHYDRAMINE HCL 50 MG/ML IJ SOLN
50.0000 mg | Freq: Once | INTRAMUSCULAR | Status: AC
  Administered 2024-01-13: 50 mg via INTRAVENOUS
  Filled 2024-01-13: qty 1

## 2024-01-13 MED ORDER — IOHEXOL 300 MG/ML  SOLN
100.0000 mL | Freq: Once | INTRAMUSCULAR | Status: AC | PRN
  Administered 2024-01-13: 100 mL via INTRAVENOUS

## 2024-01-13 MED ORDER — BUPRENORPHINE HCL 8 MG SL SUBL
8.0000 mg | SUBLINGUAL_TABLET | Freq: Every day | SUBLINGUAL | Status: DC
  Administered 2024-01-13 – 2024-01-15 (×3): 8 mg via SUBLINGUAL
  Filled 2024-01-13: qty 4
  Filled 2024-01-13 (×2): qty 1

## 2024-01-13 MED ORDER — DIPHENHYDRAMINE HCL 50 MG/ML IJ SOLN
50.0000 mg | Freq: Once | INTRAMUSCULAR | Status: AC
  Administered 2024-01-13: 50 mg via INTRAMUSCULAR
  Filled 2024-01-13: qty 1

## 2024-01-13 MED ORDER — BOOST / RESOURCE BREEZE PO LIQD CUSTOM
1.0000 | Freq: Three times a day (TID) | ORAL | Status: DC
  Administered 2024-01-14 – 2024-01-15 (×5): 1 via ORAL

## 2024-01-13 MED ORDER — KETAMINE HCL 50 MG/ML IJ SOLN
3.0000 mg/kg | Freq: Once | INTRAMUSCULAR | Status: AC
  Administered 2024-01-13: 230 mg via INTRAMUSCULAR
  Filled 2024-01-13: qty 10

## 2024-01-13 MED ORDER — PHENOBARBITAL SODIUM 65 MG/ML IJ SOLN
65.0000 mg | Freq: Three times a day (TID) | INTRAMUSCULAR | Status: DC
  Administered 2024-01-13 – 2024-01-15 (×6): 65 mg via INTRAVENOUS
  Filled 2024-01-13 (×6): qty 1

## 2024-01-13 MED ORDER — SODIUM CHLORIDE 0.9 % IV BOLUS
1000.0000 mL | Freq: Once | INTRAVENOUS | Status: AC
  Administered 2024-01-13: 1000 mL via INTRAVENOUS

## 2024-01-13 MED ORDER — DEXMEDETOMIDINE BOLUS VIA INFUSION: 1.0000 ug/kg | Freq: Once | INTRAVENOUS | Status: DC
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
## 2024-01-13 NOTE — ED Notes (Signed)
 Pt restless and is continuing to try to get out of bed, pt not redirectable

## 2024-01-13 NOTE — ED Notes (Signed)
 NP notifed of patient combative, tearing off restraints, and continues to be disoriented and non-redirectable. After phenobarbital  dose given.
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
## 2024-01-13 NOTE — ED Notes (Signed)
 Pt tried to leave department, escorted back to room by RN and security

## 2024-01-13 NOTE — ED Notes (Signed)
 Tech unable to get blood pressure, pt would not stay still and pt was yelling and cussing at staff.

## 2024-01-13 NOTE — ED Notes (Signed)
 Patient medicated with IM ketamine  per EDP due to disorientation,  screaming yelling, risk of harm to self-by pulling against restraints, attempting to remove IV and restraints with his mouth. Patient non-redirectable and not responding to reorientation.
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
## 2024-01-13 NOTE — ED Notes (Signed)
 Pt screaming, trying to take off hard restraints.
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
## 2024-01-13 NOTE — Progress Notes (Signed)
 eLink Physician-Brief Progress Note Patient Name: Johnathan Brown DOB: [birth year] MRN: [id number]   Date of Service  01/13/2024  HPI/Events of Note  Patient requesting for diet On Precedex  and Phenobarbital   eICU Interventions  Ordered clear liquid diet with strict aspiration precautions     Intervention Category Intermediate Interventions: Other:  Damien ONEIDA Grout 01/13/2024, 9:52 PM
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
## 2024-01-13 NOTE — ED Notes (Signed)
 Pt yelling in room, hitting legs on bed, becoming more agitated, not easily redirected.

## 2024-01-13 NOTE — ED Notes (Signed)
Pt yelling and cursing.

## 2024-01-13 NOTE — ED Notes (Signed)
 Patient transported to CT

## 2024-01-13 NOTE — ED Notes (Signed)
 EDP notified that patient continues to thrash in bed, pull at lines, and remains disoriented and unable to be redirected.

## 2024-01-13 NOTE — ED Notes (Signed)
 [place]. NP notifed of patient combative, tearing off restraints, and continues to be disoriented and non-redirectable. Security called to bedside.

## 2024-01-13 NOTE — ED Notes (Addendum)
 Security escorted pt back to room, orders received from EDP. Pt placed in 4 point violent restraints. Security remains at bedside at this time.

## 2024-01-13 NOTE — ED Notes (Signed)
 Pt continues to thrash around in bed, refusing vitals,

## 2024-01-14 DIAGNOSIS — R748 Abnormal levels of other serum enzymes: Secondary | ICD-10-CM

## 2024-01-14 DIAGNOSIS — E876 Hypokalemia: Secondary | ICD-10-CM

## 2024-01-14 LAB — BASIC METABOLIC PANEL WITH GFR
Anion gap: 14 (ref 5–15)
BUN: 17 mg/dL (ref 6–20)
CO2: 20 mmol/L — ABNORMAL LOW (ref 22–32)
Calcium: 8.7 mg/dL — ABNORMAL LOW (ref 8.9–10.3)
Chloride: 108 mmol/L (ref 98–111)
Creatinine, Ser: 0.73 mg/dL (ref 0.61–1.24)
GFR, Estimated: >60 mL/min (ref >60)
Glucose, Bld: 119 mg/dL — ABNORMAL HIGH (ref 70–99)
Potassium: 3.8 mmol/L (ref 3.5–5.1)
Sodium: 141 mmol/L (ref 135–145)

## 2024-01-14 LAB — MAGNESIUM: Magnesium: 2.1 mg/dL (ref 1.7–2.4)

## 2024-01-14 LAB — CBC
HCT: 41.8 % (ref 39.0–52.0)
Hemoglobin: 13.2 g/dL (ref 13.0–17.0)
MCH: 27.1 pg (ref 26.0–34.0)
MCHC: 31.6 g/dL (ref 30.0–36.0)
MCV: 85.8 fL (ref 80.0–100.0)
Platelets: 189 K/uL (ref 150–400)
RBC: 4.87 MIL/uL (ref 4.22–5.81)
RDW: 14 % (ref 11.5–15.5)
WBC: 9.1 K/uL (ref 4.0–10.5)
nRBC: 0 % (ref 0.0–0.2)

## 2024-01-14 LAB — CK: Total CK: 1580 U/L — ABNORMAL HIGH (ref 49–397)

## 2024-01-14 LAB — PHOSPHORUS: Phosphorus: 2.8 mg/dL (ref 2.5–4.6)

## 2024-01-14 MED ORDER — ACETAMINOPHEN 325 MG PO TABS
650.0000 mg | ORAL_TABLET | ORAL | Status: DC | PRN
  Administered 2024-01-14 – 2024-01-15 (×2): 650 mg via ORAL
  Filled 2024-01-14 (×2): qty 2

## 2024-01-14 MED ORDER — LORAZEPAM 2 MG/ML IJ SOLN
0.5000 mg | Freq: Once | INTRAMUSCULAR | Status: AC
  Administered 2024-01-14: 0.5 mg via INTRAVENOUS
  Filled 2024-01-14: qty 1

## 2024-01-14 MED ORDER — QUETIAPINE FUMARATE 100 MG PO TABS
100.0000 mg | ORAL_TABLET | Freq: Two times a day (BID) | ORAL | Status: DC
  Administered 2024-01-14 (×2): 100 mg via ORAL
  Filled 2024-01-14 (×2): qty 1

## 2024-01-14 MED ORDER — LACTATED RINGERS IV BOLUS
1000.0000 mL | Freq: Once | INTRAVENOUS | Status: AC
  Administered 2024-01-14: 1000 mL via INTRAVENOUS

## 2024-01-14 MED ORDER — QUETIAPINE FUMARATE 50 MG PO TABS: 50.0000 mg | ORAL_TABLET | Freq: Two times a day (BID) | ORAL | Status: DC

## 2024-01-14 MED ORDER — CLONIDINE HCL 0.1 MG PO TABS
0.1000 mg | ORAL_TABLET | Freq: Every day | ORAL | Status: DC
Start: 2024-01-14 — End: 2024-01-15
  Administered 2024-01-14 – 2024-01-15 (×2): 0.1 mg via ORAL
  Filled 2024-01-14 (×2): qty 1

## 2024-01-14 NOTE — Plan of Care (Signed)
  Problem: Safety: Goal: Non-violent Restraint(s) Outcome: Progressing   Problem: Health Behavior/Discharge Planning: Goal: Ability to manage health-related needs will improve Outcome: Progressing   Problem: Clinical Measurements: Goal: Ability to maintain clinical measurements within normal limits will improve Outcome: Progressing

## 2024-01-14 NOTE — Plan of Care (Signed)
  Problem: Safety: Goal: Non-violent Restraint(s) Outcome: Progressing   Problem: Activity: Goal: Risk for activity intolerance will decrease Outcome: Progressing   Problem: Nutrition: Goal: Adequate nutrition will be maintained Outcome: Progressing   Problem: Coping: Goal: Level of anxiety will decrease Outcome: Progressing   Problem: Pain Managment: Goal: General experience of comfort will improve and/or be controlled Outcome: Progressing   Problem: Skin Integrity: Goal: Risk for impaired skin integrity will decrease Outcome: Progressing   Problem: Safety: Goal: Violent Restraint(s) Outcome: Completed/Met
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
## 2024-01-15 ENCOUNTER — Other Ambulatory Visit (HOSPITAL_COMMUNITY): Payer: Self-pay

## 2024-01-15 DIAGNOSIS — F19951 Other psychoactive substance use, unspecified with psychoactive substance-induced psychotic disorder with hallucinations: Secondary | ICD-10-CM

## 2024-01-15 LAB — CBC
HCT: 41.9 % (ref 39.0–52.0)
Hemoglobin: 13.9 g/dL (ref 13.0–17.0)
MCH: 28.1 pg (ref 26.0–34.0)
MCHC: 33.2 g/dL (ref 30.0–36.0)
MCV: 84.6 fL (ref 80.0–100.0)
Platelets: 198 K/uL (ref 150–400)
RBC: 4.95 MIL/uL (ref 4.22–5.81)
RDW: 13.9 % (ref 11.5–15.5)
WBC: 7.6 K/uL (ref 4.0–10.5)
nRBC: 0 % (ref 0.0–0.2)

## 2024-01-15 LAB — BASIC METABOLIC PANEL WITH GFR
Anion gap: 14 (ref 5–15)
BUN: 11 mg/dL (ref 6–20)
CO2: 21 mmol/L — ABNORMAL LOW (ref 22–32)
Calcium: 8.4 mg/dL — ABNORMAL LOW (ref 8.9–10.3)
Chloride: 103 mmol/L (ref 98–111)
Creatinine, Ser: 0.65 mg/dL (ref 0.61–1.24)
GFR, Estimated: >60 mL/min (ref >60)
Glucose, Bld: 132 mg/dL — ABNORMAL HIGH (ref 70–99)
Potassium: 3.4 mmol/L — ABNORMAL LOW (ref 3.5–5.1)
Sodium: 139 mmol/L (ref 135–145)

## 2024-01-15 LAB — CK: Total CK: 905 U/L — ABNORMAL HIGH (ref 49–397)

## 2024-01-15 MED ORDER — FOLIC ACID 1 MG PO TABS
1.0000 mg | ORAL_TABLET | Freq: Every day | ORAL | Status: DC
  Administered 2024-01-15: 1 mg via ORAL
  Filled 2024-01-15: qty 1

## 2024-01-15 MED ORDER — ADULT MULTIVITAMIN W/MINERALS CH
1.0000 | ORAL_TABLET | Freq: Every day | ORAL | Status: DC
  Administered 2024-01-15: 1 via ORAL
  Filled 2024-01-15: qty 1

## 2024-01-15 MED ORDER — KETOROLAC TROMETHAMINE 15 MG/ML IJ SOLN
15.0000 mg | Freq: Four times a day (QID) | INTRAMUSCULAR | Status: DC | PRN
  Administered 2024-01-15 (×2): 15 mg via INTRAVENOUS
  Filled 2024-01-15 (×2): qty 1

## 2024-01-15 MED ORDER — QUETIAPINE FUMARATE 100 MG PO TABS
100.0000 mg | ORAL_TABLET | Freq: Every day | ORAL | Status: DC
Start: 2024-01-15 — End: 2024-01-15
  Administered 2024-01-15: 100 mg via ORAL
  Filled 2024-01-15: qty 1

## 2024-01-15 MED ORDER — THIAMINE HCL 100 MG/ML IJ SOLN
100.0000 mg | Freq: Every day | INTRAMUSCULAR | Status: DC
  Administered 2024-01-15: 100 mg via INTRAVENOUS
  Filled 2024-01-15: qty 2

## 2024-01-15 MED ORDER — CLONIDINE HCL 0.1 MG PO TABS
0.1000 mg | ORAL_TABLET | Freq: Every day | ORAL | 11 refills | Status: AC
  Filled 2024-01-15: qty 60, 60d supply, fill #0

## 2024-01-15 MED ORDER — LACTATED RINGERS IV SOLN: INTRAVENOUS | Status: DC

## 2024-01-15 MED ORDER — POTASSIUM CHLORIDE CRYS ER 20 MEQ PO TBCR
40.0000 meq | EXTENDED_RELEASE_TABLET | Freq: Once | ORAL | Status: AC
  Administered 2024-01-15: 40 meq via ORAL
  Filled 2024-01-15: qty 2

## 2024-01-15 MED ORDER — QUETIAPINE FUMARATE 50 MG PO TABS: 150.0000 mg | ORAL_TABLET | Freq: Every day | ORAL | Status: DC
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
## 2024-01-15 NOTE — Progress Notes (Signed)
 [facility] ADULT ICU REPLACEMENT PROTOCOL   The patient does apply for the [facility] Adult ICU Electrolyte Replacment Protocol based on the criteria listed below:   1.Exclusion criteria: TCTS, ECMO, Dialysis, and Myasthenia Gravis patients 2. Is GFR >/= 30 ml/min? Yes.    Patient's GFR today is >60 3. Is SCr </= 2? Yes.   Patient's SCr is 0.65 mg/dL 4. Did SCr increase >/= 0.5 in 24 hours? No. 5.Pt's weight >40kg  Yes.   6. Abnormal electrolyte(s): potassium 3.4  7. Electrolytes replaced per protocol 8.  Call MD STAT for K+ </= 2.5, Phos </= 1, or Mag </= 1 Physician:  protocol  Claretta JINNY Sharps 01/15/2024 5:17 AM'

## 2024-01-15 NOTE — Plan of Care (Signed)
  Problem: Safety: Goal: Non-violent Restraint(s) Outcome: Progressing   Problem: Pain Managment: Goal: General experience of comfort will improve and/or be controlled Outcome: Progressing   Problem: Safety: Goal: Ability to remain free from injury will improve Outcome: Progressing
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
## 2024-01-15 NOTE — TOC Transition Note (Addendum)
 Transition of Care [facility]) - Discharge Note   Patient Details  Name: Johnathan Brown MRN: [id number] Date of Birth: [birth year]  Transition of Care [facility]) CM/SW Contact:  Jon ONEIDA Anon, RN Phone Number: 01/15/2024, 3:35 PM   Clinical Narrative:    Pt to DC home. Pt evaluated by psych, IVC rescinded. Paperwork uploaded to [employer], and accepted. ICM consulted for PCP needs/ Substance use resources. Substance use resources added to AVS. As pt is currently insured through [facility] he would need to follow-up with the provider listed on his Medicaid card, or his Medicaid caseworker. There are no other ICM needs at this time.    Final next level of care: Home/Self Care Barriers to Discharge: No Barriers Identified   Patient Goals and CMS Choice Patient states their goals for this hospitalization and ongoing recovery are:: Home CMS Medicare.gov Compare Post Acute Care list provided to:: Other (Comment Required) (NA) Choice offered to / list presented to : NA [place] ownership interest in [facility].provided to:: Parent NA    Discharge Placement                       Discharge Plan and Services Additional resources added to the After Visit Summary for   In-house Referral: Clinical Social Work Discharge Planning Services: CM Consult Post Acute Care Choice: NA          DME Arranged: N/A DME Agency: NA       HH Arranged: NA HH Agency: NA        Social Drivers of Health (SDOH) Interventions SDOH Screenings   Food Insecurity: No Food Insecurity (07/20/2022)  Housing: Low Risk  (07/20/2022)  Transportation Needs: No Transportation Needs (07/20/2022)  Utilities: Not At Risk (07/20/2022)  Tobacco Use: High Risk (01/13/2024)     Readmission Risk Interventions    01/15/2024   10:37 AM  Readmission Risk Prevention Plan  Transportation Screening Complete  PCP or Specialist Appt within 5-7 Days Complete  Home Care Screening Complete  Medication Review (RN CM)  Complete
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
## 2024-01-15 NOTE — Progress Notes (Signed)
 Psych signed off and agreed with removal of IVC order, as did Critical Care Medicine. IVC rescinded. Pt discharge under own auspices with belongings and a bus pass.  01/15/24 5:46 PM Oneil LITTIE Norse, RN

## 2024-01-15 NOTE — Progress Notes (Signed)
 Discharge medication delivered to patient at bedside in a secure bag.

## 2024-01-15 NOTE — Plan of Care (Signed)
  Problem: Safety: Goal: Non-violent Restraint(s) Outcome: Adequate for Discharge   Problem: Education: Goal: Knowledge of General Education information will improve Description: Including pain rating scale, medication(s)/side effects and non-pharmacologic comfort measures Outcome: Adequate for Discharge   Problem: Health Behavior/Discharge Planning: Goal: Ability to manage health-related needs will improve Outcome: Adequate for Discharge   Problem: Clinical Measurements: Goal: Ability to maintain clinical measurements within normal limits will improve Outcome: Adequate for Discharge Goal: Will remain free from infection Outcome: Adequate for Discharge Goal: Diagnostic test results will improve Outcome: Adequate for Discharge Goal: Respiratory complications will improve Outcome: Adequate for Discharge Goal: Cardiovascular complication will be avoided Outcome: Adequate for Discharge   Problem: Activity: Goal: Risk for activity intolerance will decrease Outcome: Adequate for Discharge   Problem: Nutrition: Goal: Adequate nutrition will be maintained Outcome: Adequate for Discharge   Problem: Coping: Goal: Level of anxiety will decrease Outcome: Adequate for Discharge   Problem: Elimination: Goal: Will not experience complications related to bowel motility Outcome: Adequate for Discharge Goal: Will not experience complications related to urinary retention Outcome: Adequate for Discharge   Problem: Pain Managment: Goal: General experience of comfort will improve and/or be controlled Outcome: Adequate for Discharge   Problem: Safety: Goal: Ability to remain free from injury will improve Outcome: Adequate for Discharge   Problem: Skin Integrity: Goal: Risk for impaired skin integrity will decrease Outcome: Adequate for Discharge
